# Patient Record
Sex: Male | Born: 1935 | Race: White | Hispanic: No | Marital: Married | Smoking: Never smoker
Health system: Southern US, Community
[De-identification: ages and names within clinical notes are randomized; demographics above are authoritative.]

## PROBLEM LIST (undated history)

## (undated) DIAGNOSIS — E875 Hyperkalemia: Secondary | ICD-10-CM

## (undated) DIAGNOSIS — E785 Hyperlipidemia, unspecified: Secondary | ICD-10-CM

## (undated) DIAGNOSIS — I714 Abdominal aortic aneurysm, without rupture: Secondary | ICD-10-CM

## (undated) DIAGNOSIS — I259 Chronic ischemic heart disease, unspecified: Secondary | ICD-10-CM

## (undated) DIAGNOSIS — E669 Obesity, unspecified: Secondary | ICD-10-CM

## (undated) DIAGNOSIS — I10 Essential (primary) hypertension: Secondary | ICD-10-CM

## (undated) DIAGNOSIS — I2510 Atherosclerotic heart disease of native coronary artery without angina pectoris: Secondary | ICD-10-CM

## (undated) DIAGNOSIS — I219 Acute myocardial infarction, unspecified: Secondary | ICD-10-CM

## (undated) DIAGNOSIS — I499 Cardiac arrhythmia, unspecified: Secondary | ICD-10-CM

## (undated) HISTORY — DX: Essential (primary) hypertension: I10

## (undated) HISTORY — PX: APPENDECTOMY: SHX54

## (undated) HISTORY — DX: Hyperlipidemia, unspecified: E78.5

## (undated) HISTORY — DX: Hyperkalemia: E87.5

## (undated) HISTORY — DX: Abdominal aortic aneurysm, without rupture: I71.4

## (undated) HISTORY — DX: Obesity, unspecified: E66.9

## (undated) HISTORY — PX: BACK SURGERY: SHX140

## (undated) HISTORY — PX: TONSILLECTOMY: SUR1361

## (undated) HISTORY — DX: Chronic ischemic heart disease, unspecified: I25.9

---

## 1986-06-13 HISTORY — PX: ANGIOPLASTY: SHX39

## 1986-06-13 HISTORY — PX: JOINT REPLACEMENT: SHX530

## 2009-05-20 ENCOUNTER — Encounter: Admission: RE | Admit: 2009-05-20 | Discharge: 2009-05-20 | Payer: Self-pay | Admitting: Cardiology

## 2009-11-17 ENCOUNTER — Ambulatory Visit: Payer: Self-pay | Admitting: Vascular Surgery

## 2009-11-17 DIAGNOSIS — I714 Abdominal aortic aneurysm, without rupture, unspecified: Secondary | ICD-10-CM

## 2009-11-17 HISTORY — DX: Abdominal aortic aneurysm, without rupture: I71.4

## 2009-11-17 HISTORY — DX: Abdominal aortic aneurysm, without rupture, unspecified: I71.40

## 2010-05-19 ENCOUNTER — Ambulatory Visit: Payer: Self-pay | Admitting: Cardiology

## 2010-05-26 ENCOUNTER — Ambulatory Visit: Payer: Self-pay | Admitting: Cardiology

## 2010-07-12 ENCOUNTER — Ambulatory Visit: Payer: Self-pay | Admitting: Cardiology

## 2010-10-23 IMAGING — US US AORTA SCREENING (MEDICARE)
1 series · 14 of 25 positions shown · non-contrast
Comparison: None

CLINICAL DATA: Hypertension

ABDOMINAL AORTA SCREENING ULTRASOUND
TECHNIQUE: Ultrasound examination of the abdominal aorta was
performed as a screening evaluation for abdominal aortic aneurysm.
The proximal iliac arteries were also evaluated bilaterally.

[Series 1: us aorta screening (medicare) · 0.35mm/px · 14 of 41 slices shown]
[im 1/41]
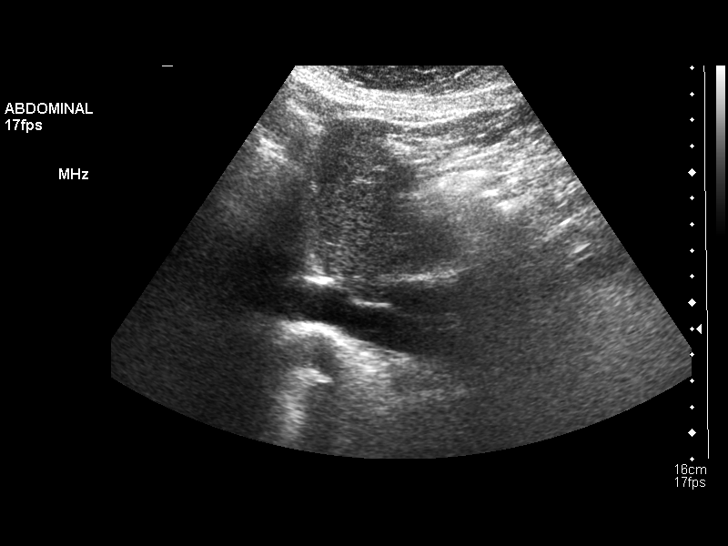
[im 4/41]
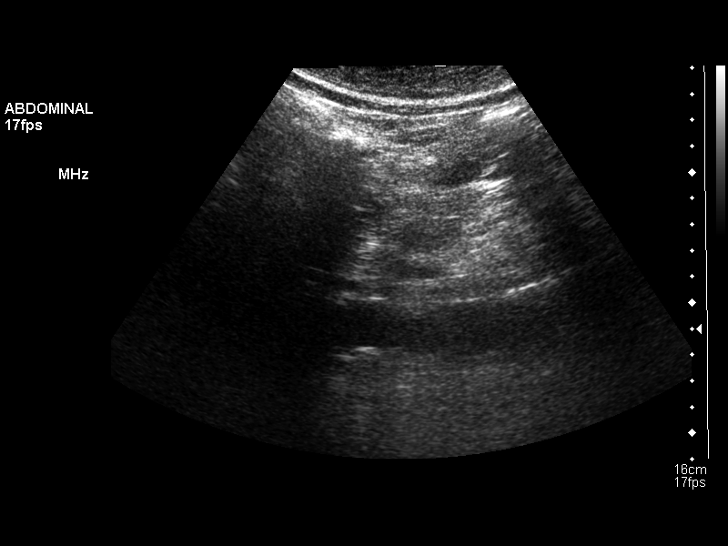
[im 7/41]
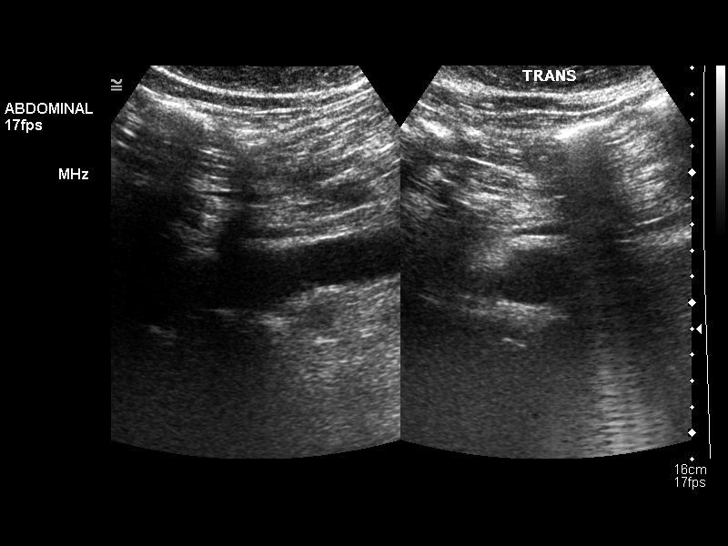
[im 11/41]
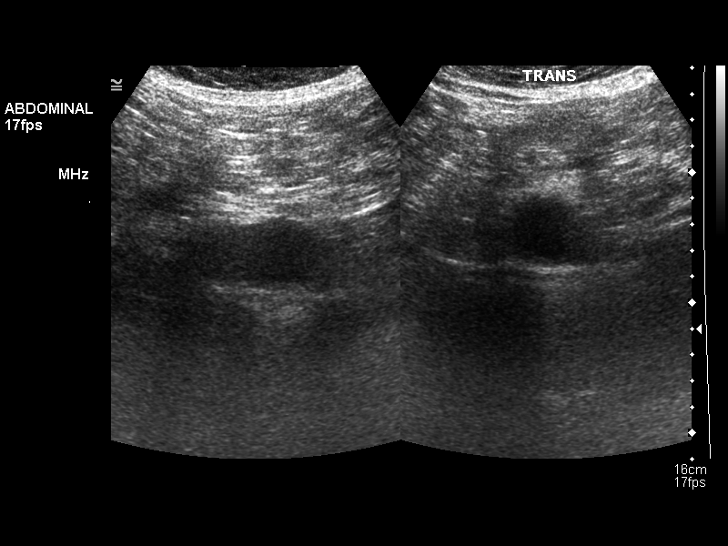
[im 14/41]
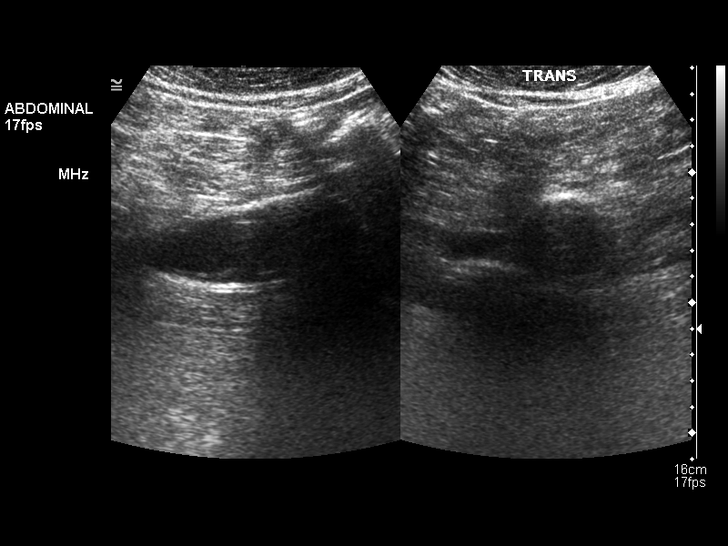
[im 16/41]
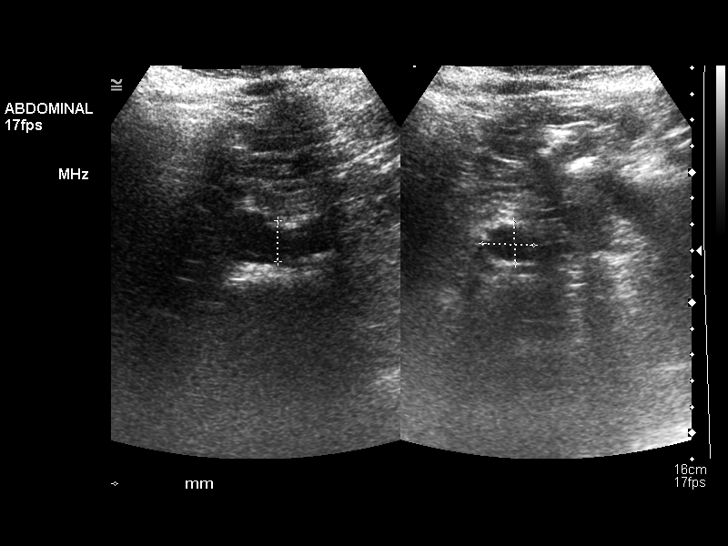
[im 19/41]
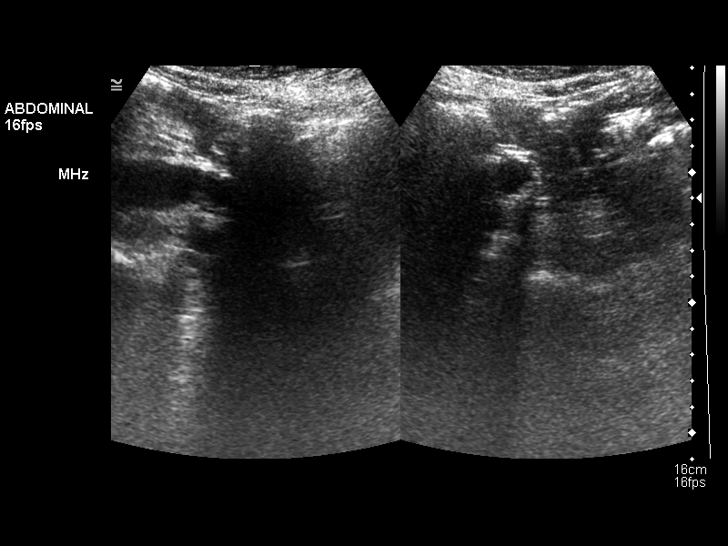
[im 22/41]
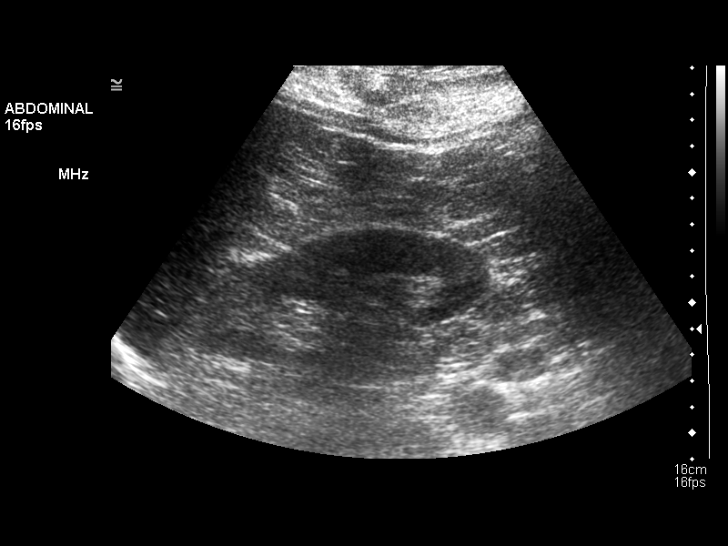
[im 26/41]
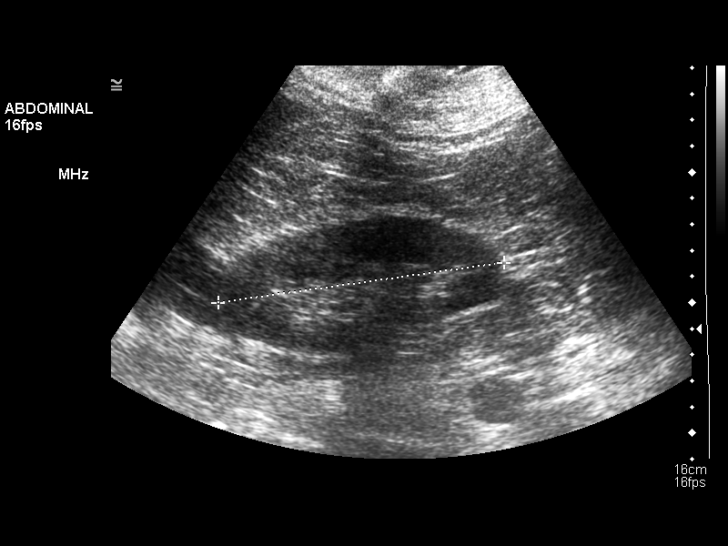
[im 27/41]
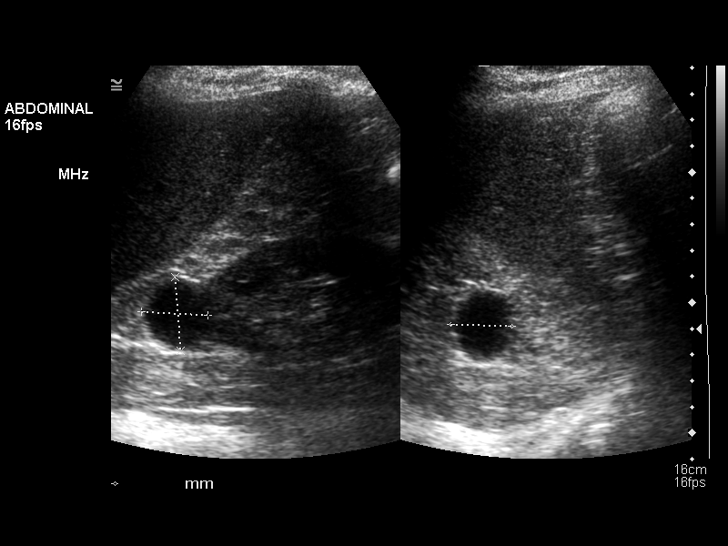
[im 31/41]
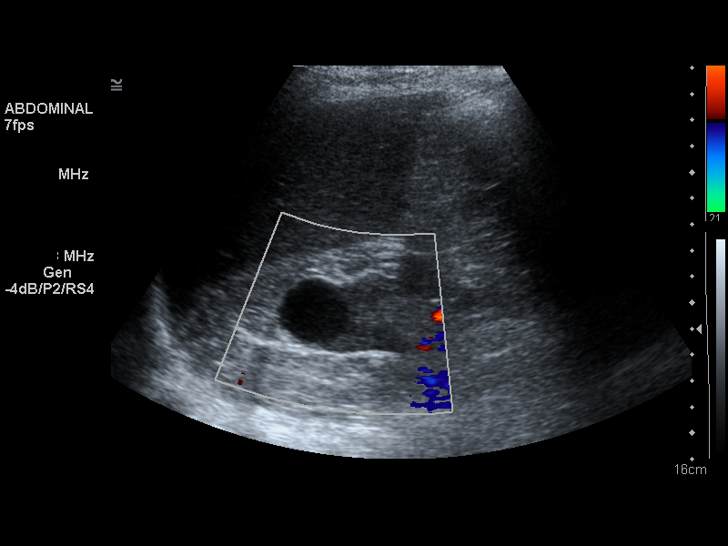
[im 34/41]
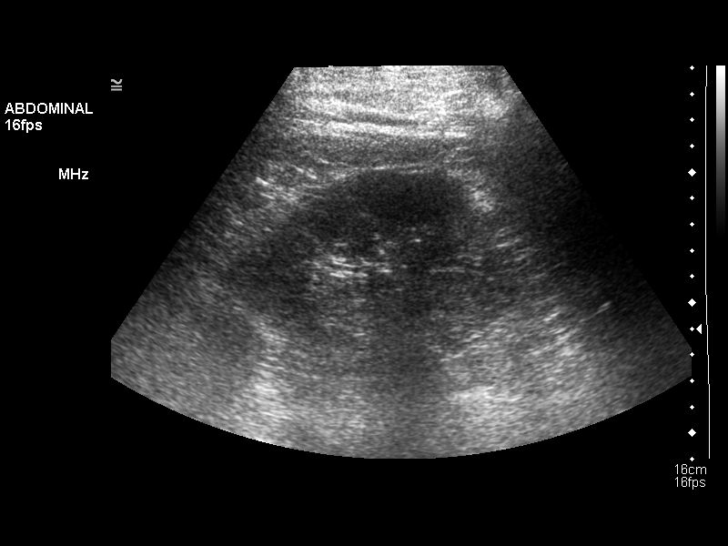
[im 37/41]
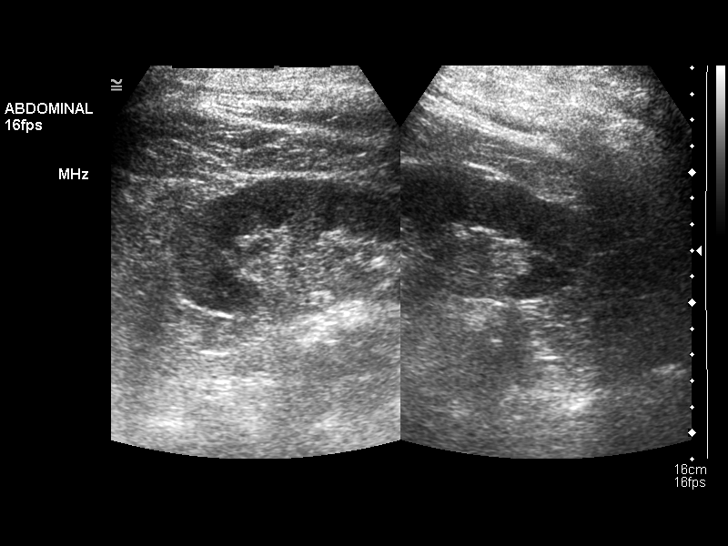
[im 41/41]
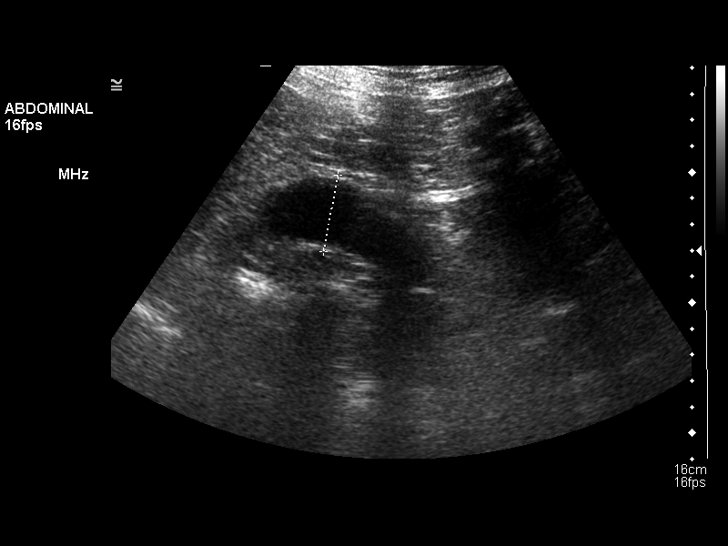

[14 of 25 positions shown; findings below may reference images not displayed]

FINDINGS: There is only a small bulge of the distal aorta with
maximum diameter of 3.3 x 3.6 cm.  Follow-up is recommended in 6
months to assess stability.  The common iliac arteries are slightly
prominent with the right measuring 18 mm in diameter and the left
measuring 19 mm.  No hydronephrosis is seen.  The right kidney
measures 11.9 cm sagittally with a cyst in the upper pole of 2.6 x
2.8 x 2.3 cm.  The left kidney measures 11.3 cm sagittally.  The
abdominal aorta is somewhat difficult to visualize due to abundant
bowel gas.
IMPRESSION: Small bulge of the distal abdominal aorta with maximal diameter of
3.3 x 3.6 cm.  Recommend follow-up ultrasound in 6-12 months to
assess stability

## 2010-10-26 NOTE — Procedures (Signed)
DUPLEX ULTRASOUND OF ABDOMINAL AORTA   INDICATION:  Known AAA.   HISTORY:  Diabetes:  No  Cardiac:  MI, angioplasty  Hypertension:  Yes  Smoking:  No  Family History:  No  Previous Surgery:  No   DUPLEX EXAM:         AP (cm)                   TRANSVERSE (cm)  Proximal             2.8 cm                    2.4 cm  Mid                  3.2 cm                    3.4 cm  Distal               2.1 cm                    2.3 cm  Right Iliac          1.5 cm                    1.5 cm  Left Iliac           1.7 cm                    1.7 cm   PREVIOUS:  Date:  AP:  TRANSVERSE:   IMPRESSION:  Abdominal aortic aneurysm with the largest diameter of 3.2  x 3.4 cm.   ___________________________________________  Quita Skye Hart [place], M.D.   NT/MEDQ  D:  11/17/2009  T:  11/17/2009  Job:  [id number]

## 2010-10-29 ENCOUNTER — Telehealth: Payer: Self-pay | Admitting: Cardiology

## 2010-10-29 NOTE — Telephone Encounter (Signed)
CALL PATIENT ABOUT WHO HE WILL SEE ONCE DR TENNANT RETIRES, WANTS TO GO TO SAME MD AS HIS SON HAS BEEN REFERRED TO.

## 2010-10-29 NOTE — Telephone Encounter (Signed)
Pt called, requesting to switch to Dr. Dietrich Pates when Dr. Cristobal Goldmann.  RN set pt up to see Dr. Tenny Craw on 11/18/10.  Pt aware of appointment.

## 2010-11-10 ENCOUNTER — Other Ambulatory Visit: Payer: Self-pay | Admitting: *Deleted

## 2010-11-10 DIAGNOSIS — E7800 Pure hypercholesterolemia, unspecified: Secondary | ICD-10-CM

## 2010-11-11 ENCOUNTER — Other Ambulatory Visit (INDEPENDENT_AMBULATORY_CARE_PROVIDER_SITE_OTHER): Payer: Medicare Other | Admitting: *Deleted

## 2010-11-11 ENCOUNTER — Telehealth: Payer: Self-pay | Admitting: *Deleted

## 2010-11-11 DIAGNOSIS — E7800 Pure hypercholesterolemia, unspecified: Secondary | ICD-10-CM

## 2010-11-11 LAB — HEPATIC FUNCTION PANEL
ALT: 28 U/L (ref 0–53)
Bilirubin, Direct: 0.2 mg/dL (ref 0.0–0.3)
Total Bilirubin: 0.9 mg/dL (ref 0.3–1.2)

## 2010-11-11 LAB — LIPID PANEL
Cholesterol: 160 mg/dL (ref 0–200)
HDL: 51.2 mg/dL (ref >39.00)
LDL Cholesterol: 87 mg/dL (ref 0–99)
Total CHOL/HDL Ratio: 3
Triglycerides: 110 mg/dL (ref 0.0–149.0)
VLDL: 22 mg/dL (ref 0.0–40.0)

## 2010-11-11 LAB — BASIC METABOLIC PANEL
BUN: 15 mg/dL (ref 6–23)
Chloride: 106 mEq/L (ref 96–112)
Creatinine, Ser: 0.9 mg/dL (ref 0.4–1.5)
GFR: 91.39 mL/min (ref >60.00)

## 2010-11-11 NOTE — Telephone Encounter (Signed)
Labs reported 

## 2010-11-13 ENCOUNTER — Encounter: Payer: Self-pay | Admitting: Internal Medicine

## 2010-11-18 ENCOUNTER — Ambulatory Visit (INDEPENDENT_AMBULATORY_CARE_PROVIDER_SITE_OTHER): Payer: Medicare Other | Admitting: Internal Medicine

## 2010-11-18 ENCOUNTER — Encounter: Payer: Self-pay | Admitting: Internal Medicine

## 2010-11-18 DIAGNOSIS — I119 Hypertensive heart disease without heart failure: Secondary | ICD-10-CM

## 2010-11-18 DIAGNOSIS — I2510 Atherosclerotic heart disease of native coronary artery without angina pectoris: Secondary | ICD-10-CM

## 2010-11-18 DIAGNOSIS — E785 Hyperlipidemia, unspecified: Secondary | ICD-10-CM

## 2010-11-18 DIAGNOSIS — I10 Essential (primary) hypertension: Secondary | ICD-10-CM

## 2010-11-18 NOTE — Progress Notes (Signed)
HPI Patient is an 77 year old with a history of CAD.  He was previously followed by S. Tennant. He is s/p MI and underwnt PTCA by Ronnald Nian. (no records) Since he was seen in clinic he has done well.  He remains very active, exercising regularly.  He denies chest pain.  No SOB.  No dizziness.  Says his BP is good at home.  No Known Allergies  Current Outpatient Prescriptions  Medication Sig Dispense Refill  . amLODipine (NORVASC) 5 MG tablet Take 5 mg by mouth. 2 po daily       . aspirin 81 MG tablet Take 81 mg by mouth daily.        Marland Kitchen atorvastatin (LIPITOR) 20 MG tablet Take 20 mg by mouth daily.        . calcium-vitamin D (OSCAL WITH D) 250-125 MG-UNIT per tablet Take 1 tablet by mouth daily.        . metoprolol (LOPRESSOR) 50 MG tablet Take 50 mg by mouth. 1 and 1/2 po bid       . multivitamin (THERAGRAN) per tablet Take 1 tablet by mouth daily.        . vitamin C (ASCORBIC ACID) 500 MG tablet Take 500 mg by mouth daily.        Marland Kitchen DISCONTD: atorvastatin (LIPITOR) 40 MG tablet Take by mouth daily.          Past Medical History  Diagnosis Date  . Ischemic heart disease   . Obesity   . HTN (hypertension)   . Hyperlipidemia   . Hyperkalemia   . Abdominal aortic aneurysm 11/17/2009    diameter of 3.2  x 3.4 cm.    Past Surgical History  Procedure Date  . Angioplasty     He had remote angioplasty to the lab following a subendocardial anterior MI.    Family History  Problem Relation Age of Onset  . Congenital heart disease Son     History   Social History  . Marital Status: Married    Spouse Name: N/A    Number of Children: 1  . Years of Education: N/A   Occupational History  .     Social History Main Topics  . Smoking status: Never Smoker   . Smokeless tobacco: Not on file  . Alcohol Use: Not on file  . Drug Use: Not on file  . Sexually Active: Not on file   Other Topics Concern  . Not on file   Social History Narrative  . No narrative on file    Review  of Systems:  All systems reviewed.  They are negative to the above problem except as previously stated.  Vital Signs: BP 148/88  Pulse 58  Resp 16  Ht 5\' 6"  (1.676 m)  Wt 180 lb (81.647 kg)  BMI 29.05 kg/m2  Physical Exam Patient is in NAD  HEENT:  Normocephalic, atraumatic. EOMI, PERRLA.  Neck: JVP is normal. No thyromegaly. No bruits.  Lungs: clear to auscultation. No rales no wheezes.  Heart: Regular rate and rhythm. Normal S1, S2. No S3.   No significant murmurs. PMI not displaced.  Abdomen:  Supple, nontender. Normal bowel sounds. No masses. No hepatomegaly.  Extremities:   Good distal pulses throughout. No lower extremity edema.  Musculoskeletal :moving all extremities.  Neuro:   alert and oriented x3.  CN II-XII grossly intact.  EKG:  Sinus bradycardia.  58 bpm.  First degree AV block.    Assessment and Plan:

## 2010-11-18 NOTE — Patient Instructions (Signed)
Your physician wants you to follow-up in: 1 year with Dr. Ross.  You will receive a reminder letter in the mail two months in advance. If you don't receive a letter, please call our office to schedule the follow-up appointment.  

## 2010-11-22 ENCOUNTER — Telehealth: Payer: Self-pay | Admitting: Internal Medicine

## 2010-11-22 DIAGNOSIS — I10 Essential (primary) hypertension: Secondary | ICD-10-CM | POA: Insufficient documentation

## 2010-11-22 DIAGNOSIS — I2510 Atherosclerotic heart disease of native coronary artery without angina pectoris: Secondary | ICD-10-CM | POA: Insufficient documentation

## 2010-11-22 DIAGNOSIS — E785 Hyperlipidemia, unspecified: Secondary | ICD-10-CM | POA: Insufficient documentation

## 2010-11-22 NOTE — Assessment & Plan Note (Signed)
No symptoms to suggest angina.  Encouraged him to stay active.

## 2010-11-22 NOTE — Telephone Encounter (Signed)
mailed

## 2010-11-22 NOTE — Assessment & Plan Note (Signed)
Keep on current regimen. 

## 2010-11-22 NOTE — Assessment & Plan Note (Signed)
Continue meds. 

## 2010-11-22 NOTE — Telephone Encounter (Signed)
Pt requesting blood work from 5-31 be mailed to him

## 2010-12-03 ENCOUNTER — Telehealth: Payer: Self-pay | Admitting: *Deleted

## 2010-12-03 DIAGNOSIS — I714 Abdominal aortic aneurysm, without rupture: Secondary | ICD-10-CM

## 2010-12-03 NOTE — Telephone Encounter (Signed)
Received message from Dr.Tennant's nurse that patient needs a follow up abdominal ultrasound for AAA.

## 2010-12-20 ENCOUNTER — Encounter (INDEPENDENT_AMBULATORY_CARE_PROVIDER_SITE_OTHER): Payer: Medicare Other | Admitting: Cardiology

## 2010-12-20 DIAGNOSIS — I723 Aneurysm of iliac artery: Secondary | ICD-10-CM

## 2010-12-20 DIAGNOSIS — I714 Abdominal aortic aneurysm, without rupture, unspecified: Secondary | ICD-10-CM

## 2010-12-23 ENCOUNTER — Telehealth: Payer: Self-pay | Admitting: Internal Medicine

## 2010-12-23 ENCOUNTER — Encounter: Payer: Self-pay | Admitting: Internal Medicine

## 2010-12-23 NOTE — Telephone Encounter (Signed)
ROI completed,Dolppler Mailed to PT Address.  12/23/10/km

## 2011-04-11 ENCOUNTER — Telehealth: Payer: Self-pay | Admitting: Internal Medicine

## 2011-04-11 MED ORDER — ATORVASTATIN CALCIUM 20 MG PO TABS: 20.0000 mg | ORAL_TABLET | Freq: Every day | ORAL | Status: DC

## 2011-04-11 MED ORDER — METOPROLOL TARTRATE 50 MG PO TABS: 75.0000 mg | ORAL_TABLET | Freq: Two times a day (BID) | ORAL | Status: DC

## 2011-04-11 NOTE — Telephone Encounter (Signed)
Please fax to [facility] mail order 90 day supply/phone [phone]/ fax [phone]

## 2011-04-14 NOTE — Telephone Encounter (Signed)
PT'S WIFE CALLING TO ASK THAT THE REFILL BE CALLED IN TODAY, REQUESTED ON Monday AND PT ALMOST OUT AND IT'S A MAIL ORDER, PT'S WIFE REQUEST CALL WHEN DONE AND OK TO LEAVE MESSAGE

## 2011-04-14 NOTE — Telephone Encounter (Signed)
Pt's wife calling again [facility] still doesn't have rx, pls call in asap and call pt's wife when done

## 2011-04-14 NOTE — Telephone Encounter (Signed)
Pt called [facility] and the refills are not in the system.  Metoprolol, and Lipitor generic. Please call this in to [facility] again and advise patient when this is done.

## 2011-04-15 ENCOUNTER — Other Ambulatory Visit: Payer: Self-pay | Admitting: *Deleted

## 2011-04-15 NOTE — Telephone Encounter (Signed)
Called into pharmacy

## 2011-04-15 NOTE — Telephone Encounter (Signed)
Couldn't send in electronically called in over phone

## 2011-06-27 ENCOUNTER — Other Ambulatory Visit: Payer: Self-pay | Admitting: Internal Medicine

## 2011-06-27 DIAGNOSIS — E782 Mixed hyperlipidemia: Secondary | ICD-10-CM

## 2011-06-27 MED ORDER — AMLODIPINE BESYLATE 5 MG PO TABS: 5.0000 mg | ORAL_TABLET | Freq: Every day | ORAL | Status: DC

## 2011-06-27 NOTE — Telephone Encounter (Signed)
New msg: pt wife calling stating that she would like a hard copy of RX amlodopine 5 mg, 1 tablet, 2 x/day--90 day supply with one years worth of refills. Pt wife stated she has had a difficult time in the past getting pt medications refilled (pt wife stated it was the fault of [facility]) and therefore pt wife would like a hard copy so she can mail in the RX. Please return pt wife call to discuss further.

## 2011-06-27 NOTE — Telephone Encounter (Signed)
Called patient and wife. Will mail hard copy of Norvasc 5 bid to pt's home. Wants to set up lab appointment for 1/15. Advised him to fast for lipid panel.

## 2011-06-28 ENCOUNTER — Other Ambulatory Visit (INDEPENDENT_AMBULATORY_CARE_PROVIDER_SITE_OTHER): Payer: Medicare Other | Admitting: *Deleted

## 2011-06-28 DIAGNOSIS — E782 Mixed hyperlipidemia: Secondary | ICD-10-CM

## 2011-06-28 LAB — LIPID PANEL
LDL Cholesterol: 87 mg/dL (ref 0–99)
Total CHOL/HDL Ratio: 3

## 2011-07-01 ENCOUNTER — Other Ambulatory Visit: Payer: Self-pay | Admitting: Internal Medicine

## 2011-07-01 NOTE — Telephone Encounter (Signed)
Refill  (2nd Request) Patient needs hardcopy of  amLODipine (NORVASC) 5 MG tablet.  Requesting call from nurse to discuss.  Patient wife can be reached at hm#

## 2011-07-04 NOTE — Telephone Encounter (Signed)
I sent this in the mail last week and confirmed with patient that he received script on 07/01/2011.

## 2011-07-14 ENCOUNTER — Other Ambulatory Visit: Payer: Self-pay | Admitting: Internal Medicine

## 2011-07-14 MED ORDER — AMLODIPINE BESYLATE 5 MG PO TABS: ORAL_TABLET | ORAL | Status: DC

## 2011-07-14 NOTE — Telephone Encounter (Signed)
Clarification for Amlodipine 5 mg, pt to take one tablet by mouth twice a day, called to [facility] mail order as requested per pt. Patient aware.

## 2011-07-14 NOTE — Telephone Encounter (Signed)
New Msg: Pt wife calling wanting to speak with Annice Pih regarding pt RX, amlodipine. Pharmacy said RX instructions are confusing and refuses to fill medication without further clarification.   Please call new RX into [facility] and/or call pharmacy to clarify RX directions.

## 2011-12-22 ENCOUNTER — Telehealth: Payer: Self-pay | Admitting: Internal Medicine

## 2011-12-22 DIAGNOSIS — E7800 Pure hypercholesterolemia, unspecified: Secondary | ICD-10-CM

## 2011-12-22 DIAGNOSIS — I10 Essential (primary) hypertension: Secondary | ICD-10-CM

## 2011-12-22 DIAGNOSIS — I2510 Atherosclerotic heart disease of native coronary artery without angina pectoris: Secondary | ICD-10-CM

## 2011-12-22 NOTE — Telephone Encounter (Signed)
PT REQUESTING BLOOD TEST PRIOR TO FU APPT WITH [facility], CAN GET ORDER?

## 2011-12-22 NOTE — Telephone Encounter (Signed)
Called patient back. He would like to set up lab work prior to seeing Dr.Ross on 8/2. Has not had any lab work done at [facility] office. Mentioned that he started Cozaar 50mg  in April. Set up for labs on 7/25

## 2012-01-05 ENCOUNTER — Other Ambulatory Visit (INDEPENDENT_AMBULATORY_CARE_PROVIDER_SITE_OTHER): Payer: Medicare Other

## 2012-01-05 DIAGNOSIS — E7800 Pure hypercholesterolemia, unspecified: Secondary | ICD-10-CM

## 2012-01-05 DIAGNOSIS — I2510 Atherosclerotic heart disease of native coronary artery without angina pectoris: Secondary | ICD-10-CM

## 2012-01-05 DIAGNOSIS — I10 Essential (primary) hypertension: Secondary | ICD-10-CM

## 2012-01-05 LAB — BASIC METABOLIC PANEL
CO2: 25 mEq/L (ref 19–32)
Calcium: 9.3 mg/dL (ref 8.4–10.5)
Creatinine, Ser: 1.1 mg/dL (ref 0.4–1.5)
GFR: 66.98 mL/min (ref >60.00)
Glucose, Bld: 101 mg/dL — ABNORMAL HIGH (ref 70–99)
Sodium: 136 mEq/L (ref 135–145)

## 2012-01-05 LAB — CBC WITH DIFFERENTIAL/PLATELET
Basophils Absolute: 0 10*3/uL (ref 0.0–0.1)
Eosinophils Relative: 4.7 % (ref 0.0–5.0)
HCT: 47.2 % (ref 39.0–52.0)
Hemoglobin: 15.4 g/dL (ref 13.0–17.0)
Lymphocytes Relative: 18.9 % (ref 12.0–46.0)
Lymphs Abs: 1.6 10*3/uL (ref 0.7–4.0)
Monocytes Relative: 10.3 % (ref 3.0–12.0)
Neutro Abs: 5.4 10*3/uL (ref 1.4–7.7)
RBC: 5.32 Mil/uL (ref 4.22–5.81)
RDW: 14.3 % (ref 11.5–14.6)
WBC: 8.3 10*3/uL (ref 4.5–10.5)

## 2012-01-05 LAB — LIPID PANEL
HDL: 45.1 mg/dL (ref >39.00)
Total CHOL/HDL Ratio: 3
Triglycerides: 83 mg/dL (ref 0.0–149.0)
VLDL: 16.6 mg/dL (ref 0.0–40.0)

## 2012-01-09 ENCOUNTER — Telehealth: Payer: Self-pay | Admitting: Internal Medicine

## 2012-01-13 ENCOUNTER — Encounter: Payer: Self-pay | Admitting: Internal Medicine

## 2012-01-13 ENCOUNTER — Ambulatory Visit (INDEPENDENT_AMBULATORY_CARE_PROVIDER_SITE_OTHER): Payer: Medicare Other | Admitting: Internal Medicine

## 2012-01-13 VITALS — BP 140/77 | HR 64 | Ht 66.0 in | Wt 180.0 lb

## 2012-01-13 DIAGNOSIS — K5900 Constipation, unspecified: Secondary | ICD-10-CM

## 2012-01-13 DIAGNOSIS — I2510 Atherosclerotic heart disease of native coronary artery without angina pectoris: Secondary | ICD-10-CM

## 2012-01-13 DIAGNOSIS — I10 Essential (primary) hypertension: Secondary | ICD-10-CM

## 2012-01-13 MED ORDER — LOSARTAN POTASSIUM 50 MG PO TABS: 50.0000 mg | ORAL_TABLET | Freq: Every day | ORAL | Status: AC

## 2012-01-13 NOTE — Progress Notes (Signed)
Marland Kitchen HPI Patient is an 78 year old with a history of CAD (s/p MI and PTCA), HTN and dyslipidemia.  He used to be a patient of Ronnald Nian. I saw him for the first time in June 2012. SInce seen he has done well from a cardiac standpoint. Breathing is OK  No dizziness.  No palpitiatons.  No CP  He remains active. No Known Allergies  Current Outpatient Prescriptions  Medication Sig Dispense Refill  . amLODipine (NORVASC) 5 MG tablet Take one tablet by mouth twice a day.  180 tablet  3  . aspirin 81 MG tablet Take 81 mg by mouth daily.        Marland Kitchen atorvastatin (LIPITOR) 20 MG tablet Take 1 tablet (20 mg total) by mouth daily.  90 tablet  1  . calcium-vitamin D (OSCAL WITH D) 250-125 MG-UNIT per tablet Take 1 tablet by mouth daily.       Marland Kitchen losartan (COZAAR) 50 MG tablet Take 1 tablet (50 mg total) by mouth daily.  90 tablet  3  . metoprolol (LOPRESSOR) 50 MG tablet Take 1.5 tablets (75 mg total) by mouth 2 (two) times daily.  270 tablet  1  . multivitamin (THERAGRAN) per tablet Take 1 tablet by mouth daily.        . polyethylene glycol (MIRALAX / GLYCOLAX) packet Take 17 g by mouth daily. CLEARLAX      . vitamin C (ASCORBIC ACID) 500 MG tablet Take 500 mg by mouth daily.          Past Medical History  Diagnosis Date  . Ischemic heart disease   . Obesity   . HTN (hypertension)   . Hyperlipidemia   . Hyperkalemia   . Abdominal aortic aneurysm 11/17/2009    diameter of 3.2  x 3.4 cm.    Past Surgical History  Procedure Date  . Angioplasty     He had remote angioplasty to the lab following a subendocardial anterior MI.    Family History  Problem Relation Age of Onset  . Congenital heart disease Son     History   Social History  . Marital Status: Married    Spouse Name: N/A    Number of Children: 1  . Years of Education: N/A   Occupational History  .     Social History Main Topics  . Smoking status: Never Smoker   . Smokeless tobacco: Not on file  . Alcohol Use: Not on file  .  Drug Use: Not on file  . Sexually Active: Not on file   Other Topics Concern  . Not on file   Social History Narrative  . No narrative on file    Review of Systems:  All systems reviewed.  They are negative to the above problem except as previously stated.  Vital Signs: BP 140/77  Pulse 64  Ht 5\' 6"  (1.676 m)  Wt 180 lb (81.647 kg)  BMI 29.05 kg/m2  Physical Exam Patient is in NAD HEENT:  Normocephalic, atraumatic. EOMI, PERRLA.  Neck: JVP is normal. No thyromegaly. No bruits.  Lungs: clear to auscultation. No rales no wheezes.  Heart: Regular rate and rhythm. Normal S1, S2. No S3.   No significant murmurs. PMI not displaced.  Abdomen:  Supple, nontender. Normal bowel sounds. No masses. No hepatomegaly.  Extremities:   Good distal pulses throughout. No lower extremity edema.  Musculoskeletal :moving all extremities.  Neuro:   alert and oriented x3.  CN II-XII grossly intact.  EKG  SR.  64.  PVCs.  LAFB.  LVH.   Assessment and Plan:  1.  CAD  No records.  S/p PTCA in past.  Active now.  No symptoms to suggest angina  2.  HTN  Adequate control of BP  3.  HL  Excellent when checked last.

## 2012-01-13 NOTE — Patient Instructions (Addendum)
Your physician wants you to follow-up in:  12 months.  You will receive a reminder letter in the mail two months in advance. If you don't receive a letter, please call our office to schedule the follow-up appointment.   

## 2012-01-16 ENCOUNTER — Other Ambulatory Visit: Payer: Self-pay | Admitting: Cardiology

## 2012-01-16 DIAGNOSIS — I714 Abdominal aortic aneurysm, without rupture: Secondary | ICD-10-CM

## 2012-01-20 ENCOUNTER — Encounter (INDEPENDENT_AMBULATORY_CARE_PROVIDER_SITE_OTHER): Payer: Medicare Other

## 2012-01-20 DIAGNOSIS — I714 Abdominal aortic aneurysm, without rupture: Secondary | ICD-10-CM

## 2012-01-23 NOTE — Addendum Note (Signed)
Addended by: Burnett Kanaris A on: 01/23/2012 03:08 PM   Modules accepted: Orders

## 2012-03-09 ENCOUNTER — Encounter: Payer: Self-pay | Admitting: Cardiology

## 2012-03-16 ENCOUNTER — Encounter: Payer: Self-pay | Admitting: Cardiology

## 2012-04-23 ENCOUNTER — Other Ambulatory Visit: Payer: Self-pay | Admitting: Internal Medicine

## 2012-05-02 ENCOUNTER — Telehealth: Payer: Self-pay | Admitting: Internal Medicine

## 2012-05-02 ENCOUNTER — Other Ambulatory Visit: Payer: Self-pay

## 2012-05-02 MED ORDER — METOPROLOL TARTRATE 50 MG PO TABS: 75.0000 mg | ORAL_TABLET | Freq: Two times a day (BID) | ORAL | Status: DC

## 2012-05-02 NOTE — Telephone Encounter (Signed)
Mrs Stifel is calling because the rx bottle of Metoprolol from the pharmacy states no refills.  She is requesting refills be sent to [facility].  New rx for metoprolol was sent to [facility] with refills.

## 2012-05-02 NOTE — Telephone Encounter (Signed)
plz return call to pt wife cell [phone], she would like to discuss medication

## 2012-06-11 ENCOUNTER — Other Ambulatory Visit: Payer: Self-pay | Admitting: Internal Medicine

## 2012-10-29 ENCOUNTER — Telehealth: Payer: Self-pay | Admitting: Internal Medicine

## 2012-10-29 ENCOUNTER — Other Ambulatory Visit: Payer: Self-pay | Admitting: *Deleted

## 2012-10-29 DIAGNOSIS — E785 Hyperlipidemia, unspecified: Secondary | ICD-10-CM

## 2012-10-29 DIAGNOSIS — I10 Essential (primary) hypertension: Secondary | ICD-10-CM

## 2012-10-29 DIAGNOSIS — I2510 Atherosclerotic heart disease of native coronary artery without angina pectoris: Secondary | ICD-10-CM

## 2012-10-29 NOTE — Telephone Encounter (Signed)
Labs ordered and patient notified that he needs to be fasting.

## 2012-10-29 NOTE — Telephone Encounter (Signed)
New problem   Pt scheduled for labs 01/25/13 and need to know if it's fasting labs. Please call pt.

## 2013-01-24 ENCOUNTER — Ambulatory Visit (INDEPENDENT_AMBULATORY_CARE_PROVIDER_SITE_OTHER): Payer: Medicare Other | Admitting: *Deleted

## 2013-01-24 ENCOUNTER — Encounter (INDEPENDENT_AMBULATORY_CARE_PROVIDER_SITE_OTHER): Payer: Medicare Other

## 2013-01-24 DIAGNOSIS — I714 Abdominal aortic aneurysm, without rupture: Secondary | ICD-10-CM

## 2013-01-24 DIAGNOSIS — E785 Hyperlipidemia, unspecified: Secondary | ICD-10-CM

## 2013-01-24 DIAGNOSIS — I2510 Atherosclerotic heart disease of native coronary artery without angina pectoris: Secondary | ICD-10-CM

## 2013-01-24 DIAGNOSIS — I10 Essential (primary) hypertension: Secondary | ICD-10-CM

## 2013-01-24 LAB — BASIC METABOLIC PANEL
BUN: 16 mg/dL (ref 6–23)
Chloride: 105 mEq/L (ref 96–112)
Glucose, Bld: 99 mg/dL (ref 70–99)
Potassium: 3.9 mEq/L (ref 3.5–5.1)
Sodium: 138 mEq/L (ref 135–145)

## 2013-01-24 LAB — LIPID PANEL
Cholesterol: 167 mg/dL (ref 0–200)
LDL Cholesterol: 104 mg/dL — ABNORMAL HIGH (ref 0–99)
VLDL: 19 mg/dL (ref 0.0–40.0)

## 2013-01-24 LAB — AST: AST: 23 U/L (ref 0–37)

## 2013-01-24 LAB — CBC WITH DIFFERENTIAL/PLATELET
Eosinophils Relative: 4 % (ref 0.0–5.0)
HCT: 44.7 % (ref 39.0–52.0)
Hemoglobin: 14.7 g/dL (ref 13.0–17.0)
Lymphs Abs: 1.5 10*3/uL (ref 0.7–4.0)
MCV: 88.2 fl (ref 78.0–100.0)
Monocytes Absolute: 0.7 10*3/uL (ref 0.1–1.0)
Monocytes Relative: 10.9 % (ref 3.0–12.0)
Neutro Abs: 4.3 10*3/uL (ref 1.4–7.7)
Platelets: 165 10*3/uL (ref 150.0–400.0)
RDW: 13.9 % (ref 11.5–14.6)
WBC: 6.8 10*3/uL (ref 4.5–10.5)

## 2013-01-25 ENCOUNTER — Other Ambulatory Visit: Payer: Medicare Other

## 2013-01-30 ENCOUNTER — Telehealth: Payer: Self-pay | Admitting: *Deleted

## 2013-01-30 NOTE — Telephone Encounter (Signed)
Message copied by Burnell Blanks on Wed Jan 30, 2013  6:50 PM ------      Message from: Dietrich Pates V      Created: Wed Jan 30, 2013  3:38 PM       CBC and BMET are normal      LDL is higher than it was 1 year ago.  Was 76  Now 104       I would keep on current regimen/  Keep active.  Watch saturated fats.      Next spring I would check lipomed. ------

## 2013-01-30 NOTE — Telephone Encounter (Signed)
Advised patient

## 2013-01-30 NOTE — Telephone Encounter (Signed)
Message copied by Burnell Blanks on Wed Jan 30, 2013  6:51 PM ------      Message from: Dietrich Pates V      Created: Thu Jan 24, 2013  3:07 PM       Mild atherosclerosis of aorta.      Aortic stize is stable  Minimally dilated       Rec repeat in 1 year. ------

## 2013-02-01 ENCOUNTER — Encounter: Payer: Self-pay | Admitting: Internal Medicine

## 2013-02-01 ENCOUNTER — Ambulatory Visit (INDEPENDENT_AMBULATORY_CARE_PROVIDER_SITE_OTHER): Payer: Medicare Other | Admitting: Internal Medicine

## 2013-02-01 VITALS — BP 140/80 | HR 60 | Resp 18 | Ht 66.0 in | Wt 185.1 lb

## 2013-02-01 DIAGNOSIS — E785 Hyperlipidemia, unspecified: Secondary | ICD-10-CM

## 2013-02-01 DIAGNOSIS — I10 Essential (primary) hypertension: Secondary | ICD-10-CM

## 2013-02-01 NOTE — Patient Instructions (Signed)
Your physician recommends that you continue on your current medications as directed. Please refer to the Current Medication list given to you today.  Your physician wants you to follow-up in: 1 year. You will receive a reminder letter in the mail two months in advance. If you don't receive a letter, please call our office to schedule the follow-up appointment.  

## 2013-02-01 NOTE — Progress Notes (Signed)
HPI Patient is an 79 year old with a history of CAD (s/p MI and PTCA), HTN and dyslipidemia.I saw him 1 year ago  He had been a patient of S Tennant's in the past. Since seen he has done well  He deines Cp  No dizziness  No SOB   He admits to eating more fatty foods  Trying to cut down on carbs  Trying to lose wt. He is very active  Works out 4x per week  Volunteers at [employer].  No Known Allergies  Current Outpatient Prescriptions  Medication Sig Dispense Refill  . amLODipine (NORVASC) 5 MG tablet TAKE 1 TABLET TWICE DAILY  180 tablet  PRN  . aspirin 81 MG tablet Take 81 mg by mouth daily.        Marland Kitchen atorvastatin (LIPITOR) 20 MG tablet TAKE 1 TABLET DAILY  90 tablet  PRN  . calcium carbonate (OS-CAL) 600 MG TABS tablet Take 600 mg by mouth 2 (two) times daily with a meal.      . losartan (COZAAR) 50 MG tablet Take 1 tablet (50 mg total) by mouth daily.  90 tablet  3  . metoprolol (LOPRESSOR) 50 MG tablet Take 1.5 tablets (75 mg total) by mouth 2 (two) times daily.  270 tablet  2  . multivitamin (THERAGRAN) per tablet Take 1 tablet by mouth daily.        . polyethylene glycol (MIRALAX / GLYCOLAX) packet Take 17 g by mouth daily. CLEARLAX      . vitamin C (ASCORBIC ACID) 500 MG tablet Take 500 mg by mouth daily.         No current facility-administered medications for this visit.    Past Medical History  Diagnosis Date  . Ischemic heart disease   . Obesity   . HTN (hypertension)   . Hyperlipidemia   . Hyperkalemia   . Abdominal aortic aneurysm 11/17/2009    diameter of 3.2  x 3.4 cm.    Past Surgical History  Procedure Laterality Date  . Angioplasty      He had remote angioplasty to the lab following a subendocardial anterior MI.    Family History  Problem Relation Age of Onset  . Congenital heart disease Son     History   Social History  . Marital Status: Married    Spouse Name: N/A    Number of Children: 1  . Years of Education: N/A   Occupational History  .      Social History Main Topics  . Smoking status: Never Smoker   . Smokeless tobacco: Not on file  . Alcohol Use: Not on file  . Drug Use: Not on file  . Sexual Activity: Not on file   Other Topics Concern  . Not on file   Social History Narrative  . No narrative on file    Review of Systems:  All systems reviewed.  They are negative to the above problem except as previously stated.  Vital Signs: BP 140/80  Pulse 60  Resp 18  Ht 5\' 6"  (1.676 m)  Wt 185 lb 1.9 oz (83.97 kg)  BMI 29.89 kg/m2  Physical Exam Patient is in NAD HEENT:  Normocephalic, atraumatic. EOMI, PERRLA.  Neck: JVP is normal.  No bruits.  Lungs: clear to auscultation. No rales no wheezes.  Heart: Regular rate and rhythm. Normal S1, S2. No S3.   No significant murmurs. PMI not displaced.  Abdomen:  Supple, nontender. Normal bowel sounds. No masses. No hepatomegaly.  Extremities:   Good distal pulses throughout. No lower extremity edema.  Musculoskeletal :moving all extremities.  Neuro:   alert and oriented x3.  CN II-XII grossly intact.  EKG  SB 55  First degree AV block.  Pr interval 328 msec  Occasional PVC Assessment and Plan:  1.  CAD  Asymptomatic  Encouraged him to stay active  2.  HTn  Adequate control  3.  HL  LDL is higher than it has been  He did well for a long time  Admits to diet indescretions.  Will write order for him to have checked at Dr Tenny Craw' office.  F/U 1 year.

## 2013-02-18 ENCOUNTER — Other Ambulatory Visit: Payer: Self-pay | Admitting: Internal Medicine

## 2013-02-22 ENCOUNTER — Other Ambulatory Visit: Payer: Self-pay

## 2013-02-22 MED ORDER — ATORVASTATIN CALCIUM 20 MG PO TABS: ORAL_TABLET | ORAL | Status: DC

## 2013-03-07 ENCOUNTER — Other Ambulatory Visit: Payer: Self-pay

## 2013-03-07 MED ORDER — ATORVASTATIN CALCIUM 20 MG PO TABS: ORAL_TABLET | ORAL | Status: DC

## 2013-03-07 MED ORDER — AMLODIPINE BESYLATE 5 MG PO TABS: ORAL_TABLET | ORAL | Status: DC

## 2013-03-07 MED ORDER — METOPROLOL TARTRATE 50 MG PO TABS: ORAL_TABLET | ORAL | Status: DC

## 2013-07-23 ENCOUNTER — Encounter: Payer: Self-pay | Admitting: Internal Medicine

## 2013-12-26 NOTE — Telephone Encounter (Signed)
Close encounter 

## 2014-02-14 ENCOUNTER — Other Ambulatory Visit (HOSPITAL_COMMUNITY): Payer: Self-pay | Admitting: *Deleted

## 2014-02-14 DIAGNOSIS — I714 Abdominal aortic aneurysm, without rupture, unspecified: Secondary | ICD-10-CM

## 2014-02-21 ENCOUNTER — Ambulatory Visit (INDEPENDENT_AMBULATORY_CARE_PROVIDER_SITE_OTHER): Payer: Medicare Other | Admitting: *Deleted

## 2014-02-21 DIAGNOSIS — E785 Hyperlipidemia, unspecified: Secondary | ICD-10-CM

## 2014-02-21 DIAGNOSIS — I10 Essential (primary) hypertension: Secondary | ICD-10-CM

## 2014-02-23 LAB — NMR LIPOPROFILE WITH LIPIDS
CHOLESTEROL, TOTAL: 169 mg/dL (ref 100–199)
HDL PARTICLE NUMBER: 22.6 umol/L — AB (ref >=30.5)
HDL Size: 9.3 nm (ref >=9.2)
HDL-C: 46 mg/dL (ref >39)
LARGE HDL: 6.2 umol/L (ref >=4.8)
LDL CALC: 102 mg/dL — AB (ref 0–99)
LDL Particle Number: 1215 nmol/L — ABNORMAL HIGH (ref <1000)
LDL SIZE: 20.7 nm (ref >=20.8)
LP-IR SCORE: 30 (ref <=45)
Large VLDL-P: 1.4 nmol/L (ref <=2.7)
SMALL LDL PARTICLE NUMBER: 335 nmol/L (ref <=527)
Triglycerides: 105 mg/dL (ref 0–149)
VLDL Size: 41.1 nm (ref <=46.6)

## 2014-02-28 ENCOUNTER — Ambulatory Visit (HOSPITAL_COMMUNITY): Payer: Medicare Other | Attending: Cardiovascular Disease | Admitting: Cardiology

## 2014-02-28 ENCOUNTER — Telehealth: Payer: Self-pay | Admitting: Internal Medicine

## 2014-02-28 ENCOUNTER — Ambulatory Visit (INDEPENDENT_AMBULATORY_CARE_PROVIDER_SITE_OTHER): Payer: Medicare Other | Admitting: Internal Medicine

## 2014-02-28 ENCOUNTER — Encounter: Payer: Self-pay | Admitting: Internal Medicine

## 2014-02-28 VITALS — BP 138/76 | HR 46 | Ht 66.0 in | Wt 166.0 lb

## 2014-02-28 DIAGNOSIS — Z7982 Long term (current) use of aspirin: Secondary | ICD-10-CM | POA: Insufficient documentation

## 2014-02-28 DIAGNOSIS — E785 Hyperlipidemia, unspecified: Secondary | ICD-10-CM | POA: Insufficient documentation

## 2014-02-28 DIAGNOSIS — I714 Abdominal aortic aneurysm, without rupture, unspecified: Secondary | ICD-10-CM

## 2014-02-28 DIAGNOSIS — I4891 Unspecified atrial fibrillation: Secondary | ICD-10-CM

## 2014-02-28 DIAGNOSIS — I700 Atherosclerosis of aorta: Secondary | ICD-10-CM

## 2014-02-28 DIAGNOSIS — I10 Essential (primary) hypertension: Secondary | ICD-10-CM | POA: Insufficient documentation

## 2014-02-28 DIAGNOSIS — I252 Old myocardial infarction: Secondary | ICD-10-CM | POA: Diagnosis not present

## 2014-02-28 DIAGNOSIS — Z79899 Other long term (current) drug therapy: Secondary | ICD-10-CM | POA: Insufficient documentation

## 2014-02-28 DIAGNOSIS — Z9861 Coronary angioplasty status: Secondary | ICD-10-CM | POA: Insufficient documentation

## 2014-02-28 DIAGNOSIS — I2510 Atherosclerotic heart disease of native coronary artery without angina pectoris: Secondary | ICD-10-CM | POA: Insufficient documentation

## 2014-02-28 LAB — BASIC METABOLIC PANEL
BUN: 29 mg/dL — ABNORMAL HIGH (ref 6–23)
CALCIUM: 10.1 mg/dL (ref 8.4–10.5)
CO2: 25 mEq/L (ref 19–32)
Chloride: 101 mEq/L (ref 96–112)
Creatinine, Ser: 1.4 mg/dL (ref 0.4–1.5)
GFR: 50.98 mL/min — AB (ref >60.00)
GLUCOSE: 111 mg/dL — AB (ref 70–99)
Potassium: 4.7 mEq/L (ref 3.5–5.1)
Sodium: 138 mEq/L (ref 135–145)

## 2014-02-28 LAB — CBC
HEMATOCRIT: 49.8 % (ref 39.0–52.0)
Hemoglobin: 16.3 g/dL (ref 13.0–17.0)
MCHC: 32.7 g/dL (ref 30.0–36.0)
MCV: 87.5 fl (ref 78.0–100.0)
Platelets: 205 10*3/uL (ref 150.0–400.0)
RBC: 5.69 Mil/uL (ref 4.22–5.81)
RDW: 14.8 % (ref 11.5–15.5)
WBC: 10.1 10*3/uL (ref 4.0–10.5)

## 2014-02-28 LAB — TSH: TSH: 1.01 u[IU]/mL (ref 0.35–4.50)

## 2014-02-28 MED ORDER — APIXABAN 5 MG PO TABS: 5.0000 mg | ORAL_TABLET | Freq: Two times a day (BID) | ORAL | Status: DC

## 2014-02-28 MED ORDER — METOPROLOL TARTRATE 50 MG PO TABS: 50.0000 mg | ORAL_TABLET | Freq: Two times a day (BID) | ORAL | Status: DC

## 2014-02-28 NOTE — Patient Instructions (Signed)
Your physician has recommended you make the following change in your medication:  1.) decrease metoprolol to 50 mg (1 tablet) twice daily 2.) start eliquis 5 mg twice daily  Your physician recommends that you return for lab work today.  (cbc, bmet, tsh)  Your physician has requested that you have an echocardiogram. Echocardiography is a painless test that uses sound waves to create images of your heart. It provides your doctor with information about the size and shape of your heart and how well your heart's chambers and valves are working. This procedure takes approximately one hour. There are no restrictions for this procedure.  ++++SCHEDULE FOR IN ABOUT 10 DAYS++++Your physician has recommended that you wear a holter monitor. Holter monitors are medical devices that record the heart's electrical activity. Doctors most often use these monitors to diagnose arrhythmias. Arrhythmias are problems with the speed or rhythm of the heartbeat. The monitor is a small, portable device. You can wear one while you do your normal daily activities. This is usually used to diagnose what is causing palpitations/syncope (passing out).  Your physician recommends that you schedule a follow-up appointment in: 2 months with Dr. Tenny Craw.

## 2014-02-28 NOTE — Progress Notes (Signed)
HPI Patient is an 80 year old with a history of CAD (s/p MI and PTCA), HTN and dyslipidemia.I saw him 1 year ago  SInce seen he has done well  He is exercising regularly  Dieting.   He denies palpitations.  No dizziness  No SOB  No CP    No Known Allergies  Current Outpatient Prescriptions  Medication Sig Dispense Refill  . amLODipine (NORVASC) 5 MG tablet TAKE 1 TABLET TWICE DAILY  180 tablet  4  . aspirin 81 MG tablet Take 81 mg by mouth daily.        Marland Kitchen atorvastatin (LIPITOR) 20 MG tablet TAKE 1 TABLET DAILY  90 tablet  4  . calcium carbonate (OS-CAL) 600 MG TABS tablet Take 600 mg by mouth 2 (two) times daily with a meal.      . losartan (COZAAR) 50 MG tablet Take 1 tablet (50 mg total) by mouth daily.  90 tablet  3  . metoprolol (LOPRESSOR) 50 MG tablet TAKE 1 AND 1/2 TABLETS TWICE DAILY  270 tablet  3  . multivitamin (THERAGRAN) per tablet Take 1 tablet by mouth daily.        . pantoprazole (PROTONIX) 40 MG tablet Take 40 mg by mouth daily.      . polyethylene glycol (MIRALAX / GLYCOLAX) packet Take 17 g by mouth daily. CLEARLAX      . vitamin C (ASCORBIC ACID) 500 MG tablet Take 500 mg by mouth daily.         No current facility-administered medications for this visit.    Past Medical History  Diagnosis Date  . Ischemic heart disease   . Obesity   . HTN (hypertension)   . Hyperlipidemia   . Hyperkalemia   . Abdominal aortic aneurysm 11/17/2009    diameter of 3.2  x 3.4 cm.    Past Surgical History  Procedure Laterality Date  . Angioplasty      He had remote angioplasty to the lab following a subendocardial anterior MI.    Family History  Problem Relation Age of Onset  . Congenital heart disease Son     History   Social History  . Marital Status: Married    Spouse Name: N/A    Number of Children: 1  . Years of Education: N/A   Occupational History  .     Social History Main Topics  . Smoking status: Never Smoker   . Smokeless tobacco: Not on file  .  Alcohol Use: Not on file  . Drug Use: Not on file  . Sexual Activity: Not on file   Other Topics Concern  . Not on file   Social History Narrative  . No narrative on file    Review of Systems:  All systems reviewed.  They are negative to the above problem except as previously stated.  Vital Signs: BP 138/76  Pulse 46  Ht  (1.676 m)  Wt 166 lb (75.297 kg)  BMI 26.81 kg/m2  Physical Exam Patient is in NAD HEENT:  Normocephalic, atraumatic. EOMI, PERRLA.  Neck: JVP is normal.  No bruits.  Lungs: clear to auscultation. No rales no wheezes.  Heart: Irregular rate and rhythm. Normal S1, S2. No S3.   No significant murmurs. PMI not displaced.  Abdomen:  Supple, nontender. Normal bowel sounds. No masses. No hepatomegaly.  Extremities:   Good distal pulses throughout. No lower extremity edema.  Musculoskeletal :moving all extremities.  Neuro:   alert and oriented x3.  CN  II-XII grossly intact.  EKG  Atrial fibrillation  42 bpm  LAFB  Occasional PVC  Assessment and Plan: 1  Atrial fb  New  Never documented before.  Patient is asymptomatic but has a slow rate Will decrease lopressor to 50 bid.  Start eliquis. Get labs  Get echo  Review with patient  Consider cardioversion after 1 month.  2.  CAD  Asymptomatic  Encouraged him to stay active  3.  HTn  Adequate control  3.  HL Continue lipitor.    F/U 1 year.

## 2014-02-28 NOTE — Telephone Encounter (Signed)
Informed to continue aspirin while on eliquis also. Per Dr. Tenny Craw pt has CAD also.

## 2014-02-28 NOTE — Telephone Encounter (Signed)
New message     Pt is not on eliquis.  Should he stop taking his aspirin?

## 2014-02-28 NOTE — Progress Notes (Signed)
Aorto-iliac artery duplex performed

## 2014-03-03 ENCOUNTER — Other Ambulatory Visit: Payer: Self-pay | Admitting: *Deleted

## 2014-03-03 MED ORDER — APIXABAN 5 MG PO TABS: 5.0000 mg | ORAL_TABLET | Freq: Two times a day (BID) | ORAL | Status: DC

## 2014-03-04 ENCOUNTER — Other Ambulatory Visit (HOSPITAL_COMMUNITY): Payer: Medicare Other

## 2014-03-06 ENCOUNTER — Telehealth: Payer: Self-pay | Admitting: Internal Medicine

## 2014-03-06 NOTE — Telephone Encounter (Signed)
New message    Wife calling   Patient has an  eye appt on Monday making sure the equipment that the ophthalmology  using will not effect his heart

## 2014-03-07 NOTE — Telephone Encounter (Signed)
Spoke with patient's wife. Pt getting holter on Monday.   Advised her a routine eye exam/appointment will not interfere with wearing this monitor. She verbalizes understanding.

## 2014-03-10 ENCOUNTER — Encounter: Payer: Self-pay | Admitting: *Deleted

## 2014-03-10 ENCOUNTER — Ambulatory Visit (HOSPITAL_COMMUNITY): Payer: Medicare Other | Attending: Internal Medicine | Admitting: Radiology

## 2014-03-10 ENCOUNTER — Encounter (INDEPENDENT_AMBULATORY_CARE_PROVIDER_SITE_OTHER): Payer: Medicare Other

## 2014-03-10 DIAGNOSIS — I252 Old myocardial infarction: Secondary | ICD-10-CM | POA: Diagnosis not present

## 2014-03-10 DIAGNOSIS — I4891 Unspecified atrial fibrillation: Secondary | ICD-10-CM | POA: Diagnosis not present

## 2014-03-10 DIAGNOSIS — I714 Abdominal aortic aneurysm, without rupture, unspecified: Secondary | ICD-10-CM | POA: Diagnosis not present

## 2014-03-10 DIAGNOSIS — I2510 Atherosclerotic heart disease of native coronary artery without angina pectoris: Secondary | ICD-10-CM | POA: Insufficient documentation

## 2014-03-10 DIAGNOSIS — I079 Rheumatic tricuspid valve disease, unspecified: Secondary | ICD-10-CM | POA: Diagnosis not present

## 2014-03-10 DIAGNOSIS — E669 Obesity, unspecified: Secondary | ICD-10-CM | POA: Diagnosis not present

## 2014-03-10 DIAGNOSIS — I10 Essential (primary) hypertension: Secondary | ICD-10-CM | POA: Insufficient documentation

## 2014-03-10 DIAGNOSIS — I2589 Other forms of chronic ischemic heart disease: Secondary | ICD-10-CM | POA: Insufficient documentation

## 2014-03-10 DIAGNOSIS — E785 Hyperlipidemia, unspecified: Secondary | ICD-10-CM | POA: Diagnosis not present

## 2014-03-10 NOTE — Progress Notes (Signed)
Echocardiogram performed.  

## 2014-03-10 NOTE — Progress Notes (Signed)
Patient ID: Gerald Wiggins, male   DOB: [birth year], 80 y.o.   MRN: [id number] Pt in the office today for Echocardiogram.  Echo tech Erie Noe) noted that the pt's pulse ran between 35-45 during test.  The pt is asymptomatic with bradycardia.  Discussed with Dr Excell Seltzer (DOD) and he advised the pt to decrease Metoprolol Tartrate to  twice a day and continue with heart monitor appointment. Pt verbalized understanding of medication instructions.

## 2014-03-10 NOTE — Progress Notes (Signed)
Patient ID: Gerald Wiggins, male   DOB: 1925-04-10, 80 y.o.   MRN: [id number] E-Cardio 48 hour holter monitor applied to patient.

## 2014-03-13 ENCOUNTER — Telehealth: Payer: Self-pay

## 2014-03-13 NOTE — Telephone Encounter (Signed)
Patient returned call for results of ECHO.  Informed patient of results and explained normal EF. Patient concerned about Dr. Excell Seltzerooper decreasing his Metoprolol dosage and wants Dr. Tenny Crawoss' input. Informed patient that his heart rate was 46 and Metoprolol can lower it even more, and that is why Dr. Excell Seltzerooper decreased dosage.  Patient still wants Dr. Tenny Crawoss' OK, so sending to Dr. Tenny Crawoss for review.

## 2014-03-13 NOTE — Telephone Encounter (Signed)
Called patient and discussed that echo was normal, even though his heart rate was slow.   Also informed that Dr. Tenny Crawoss is in agreement with decreasing the metoprolol to 1/2 pill twice a day as he was instructed for his slow rate. Pt verbalizes understanding and was appreciative of the call.

## 2014-03-14 ENCOUNTER — Telehealth: Payer: Self-pay

## 2014-03-14 NOTE — Telephone Encounter (Signed)
Spoke with patient's wife. She manages his medications. He already took 25 mg metoprolol this am. She will remove all other metoprolol from his pill boxes due to slow rate and short pauses. Advised her Dr. Tenny Crawoss will call once she reviews the study. Spouse verbalizes understanding and will inform patient.

## 2014-03-14 NOTE — Telephone Encounter (Signed)
Dennis from [employer] called to give 48 hour Holter report.  Patient wore monitor 9/28-9/29.  1st day, patient had 9 pauses greater than 3 seconds. Longest pause 3.3s. 2nd day, patient had 23 pauses greater than 3 seconds. Longest pause 4.27s.  Routing to Dr. Tenny Crawoss for review.

## 2014-03-14 NOTE — Telephone Encounter (Signed)
Stop metoprolol Wil need to review full holter. HR is overall slow .  Good thing is that has not been symtpomatic Will call patietn once i have reviewed full study.

## 2014-03-29 ENCOUNTER — Other Ambulatory Visit: Payer: Self-pay | Admitting: Internal Medicine

## 2014-03-29 NOTE — Telephone Encounter (Signed)
Rx was sent to pharmacy electronically. 

## 2014-04-08 ENCOUNTER — Telehealth: Payer: Self-pay | Admitting: *Deleted

## 2014-04-08 NOTE — Telephone Encounter (Signed)
Called patient to inform per Dr. Charlott Rakesoss's note:  After review, I would recomm stopping aspirin . Keep on Eliquis.   Pt informed. Verbalizes understanding and agreement.

## 2014-05-01 ENCOUNTER — Encounter: Payer: Self-pay | Admitting: Internal Medicine

## 2014-05-01 ENCOUNTER — Ambulatory Visit (INDEPENDENT_AMBULATORY_CARE_PROVIDER_SITE_OTHER): Payer: Medicare Other | Admitting: Internal Medicine

## 2014-05-01 VITALS — BP 120/82 | HR 80 | Ht 66.0 in | Wt 164.0 lb

## 2014-05-01 DIAGNOSIS — I10 Essential (primary) hypertension: Secondary | ICD-10-CM

## 2014-05-01 DIAGNOSIS — I4891 Unspecified atrial fibrillation: Secondary | ICD-10-CM

## 2014-05-01 DIAGNOSIS — E785 Hyperlipidemia, unspecified: Secondary | ICD-10-CM

## 2014-05-01 DIAGNOSIS — I2510 Atherosclerotic heart disease of native coronary artery without angina pectoris: Secondary | ICD-10-CM

## 2014-05-01 LAB — CBC
HEMATOCRIT: 47.6 % (ref 39.0–52.0)
Hemoglobin: 15.3 g/dL (ref 13.0–17.0)
MCHC: 32.2 g/dL (ref 30.0–36.0)
MCV: 88.8 fl (ref 78.0–100.0)
Platelets: 230 10*3/uL (ref 150.0–400.0)
RBC: 5.36 Mil/uL (ref 4.22–5.81)
RDW: 15.2 % (ref 11.5–15.5)
WBC: 6.9 10*3/uL (ref 4.0–10.5)

## 2014-05-01 LAB — TSH: TSH: 2 u[IU]/mL (ref 0.35–4.50)

## 2014-05-01 NOTE — Patient Instructions (Signed)
Your physician recommends that you continue on your current medications as directed. Please refer to the Current Medication list given to you today. Your physician recommends that you return for lab work today (CBC, BMET) Your physician recommends that you schedule a follow-up appointment in: 4 MONTHS WITH DR. Tenny CrawOSS.

## 2014-05-01 NOTE — Progress Notes (Signed)
HPI Patient is an 80 year old with a history of CAD (s/p MI and PTCA), HTN and dyslipidemia.I saw him 1 year ago  SInce seen he has done well  He is exercising regularly  Dieting.    I saw him earlier this fall  In afib    Started on anticoagulation   Set up for echo  HR noted to be slow  Set up for holter Echo LVEF 50 to 55%  Metoprolol stopped  Since seen he has done well  Denies CP  No dizziness  No SOB  Goes to gym each day No change in this    No Known Allergies  Current Outpatient Prescriptions  Medication Sig Dispense Refill  . amLODipine (NORVASC) 5 MG tablet TAKE 1 TABLET TWICE DAILY 180 tablet 4  . amoxicillin (AMOXIL) 500 MG capsule Take 500 mg by mouth 3 (three) times daily. Take 4 capsules prior to dental procedure    . atorvastatin (LIPITOR) 20 MG tablet TAKE 1 TABLET DAILY 90 tablet 4  . calcium carbonate (OS-CAL) 600 MG TABS tablet Take 600 mg by mouth daily.     Marland Kitchen. ELIQUIS 5 MG TABS tablet TAKE 1 TABLET BY MOUTH TWICE A DAY 60 tablet 5  . losartan (COZAAR) 50 MG tablet Take 1 tablet (50 mg total) by mouth daily. 90 tablet 3  . multivitamin (THERAGRAN) per tablet Take 1 tablet by mouth daily.      . pantoprazole (PROTONIX) 40 MG tablet Take 40 mg by mouth daily.    . polyethylene glycol (MIRALAX / GLYCOLAX) packet Take 17 g by mouth daily. CLEARLAX    . vitamin C (ASCORBIC ACID) 500 MG tablet Take 500 mg by mouth daily.       No current facility-administered medications for this visit.    Past Medical History  Diagnosis Date  . Ischemic heart disease   . Obesity   . HTN (hypertension)   . Hyperlipidemia   . Hyperkalemia   . Abdominal aortic aneurysm 11/17/2009    diameter of 3.2  x 3.4 cm.    Past Surgical History  Procedure Laterality Date  . Angioplasty      He had remote angioplasty to the lab following a subendocardial anterior MI.    Family History  Problem Relation Age of Onset  . Congenital heart disease Son   . Heart attack Neg Hx   . Stroke Neg  Hx     History   Social History  . Marital Status: Married    Spouse Name: N/A    Number of Children: 1  . Years of Education: N/A   Occupational History  .     Social History Main Topics  . Smoking status: Never Smoker   . Smokeless tobacco: Not on file  . Alcohol Use: Not on file  . Drug Use: Not on file  . Sexual Activity: Not on file   Other Topics Concern  . Not on file   Social History Narrative    Review of Systems:  All systems reviewed.  They are negative to the above problem except as previously stated.  Vital Signs: BP 120/82 mmHg  Pulse 80  Ht 5\' 6"  (1.676 m)  Wt 164 lb (74.39 kg)  BMI 26.48 kg/m2  SpO2 97%  Physical Exam Patient is in NAD HEENT:  Normocephalic, atraumatic. EOMI, PERRLA.  Neck: JVP is normal.  No bruits.  Lungs: clear to auscultation. No rales no wheezes.  Heart: Irregular rate and rhythm. Normal  S1, S2. No S3.   No significant murmurs. PMI not displaced.  Abdomen:  Supple, nontender. Normal bowel sounds. No masses. No hepatomegaly.  Extremities:   Good distal pulses throughout. No lower extremity edema.  Musculoskeletal :moving all extremities.  Neuro:   alert and oriented x3.  CN II-XII grossly intact.  EKG  Atrial fibrillation 65  Occasional PVC    Assessment and Plan: 1  Atrial fb Keep on current regimen  Check labs    2.  CAD  Asymptomatic  Encouraged him to stay active  3.  Bradycardia  Follow off of metoprolol  Asymptomatic.    4.  HTn  Adequate control  5.  HL  Keep on statin.    3.  HL Continue lipitor.    F/U 1 year.

## 2014-08-20 ENCOUNTER — Encounter: Payer: Self-pay | Admitting: Internal Medicine

## 2014-08-25 ENCOUNTER — Telehealth: Payer: Self-pay | Admitting: Internal Medicine

## 2014-08-25 DIAGNOSIS — E785 Hyperlipidemia, unspecified: Secondary | ICD-10-CM

## 2014-08-25 MED ORDER — ATORVASTATIN CALCIUM 40 MG PO TABS: ORAL_TABLET | ORAL | Status: DC

## 2014-08-25 NOTE — Telephone Encounter (Signed)
Result Note     LDL was 119 With hx of CAD it should be lower     I would incrase lipitor to 40 mg     Check lipid in 6 to 8 wks    Spoke with patient, informed. Appointment made for lipids in 6 weeks. New dose of lipitor ordered through [facility] per patient's request.

## 2014-08-25 NOTE — Telephone Encounter (Signed)
New Message ° ° °Patient is returning the nurses call. Please call back  °

## 2014-08-29 ENCOUNTER — Ambulatory Visit (INDEPENDENT_AMBULATORY_CARE_PROVIDER_SITE_OTHER): Payer: Medicare Other | Admitting: Internal Medicine

## 2014-08-29 ENCOUNTER — Encounter: Payer: Self-pay | Admitting: Internal Medicine

## 2014-08-29 VITALS — BP 130/60 | HR 71 | Ht 66.0 in | Wt 153.0 lb

## 2014-08-29 DIAGNOSIS — R55 Syncope and collapse: Secondary | ICD-10-CM

## 2014-08-29 NOTE — Patient Instructions (Signed)
Your physician recommends that you continue on your current medications as directed. Please refer to the Current Medication list given to you today.  Your physician has recommended that you wear a holter monitor. Holter monitors are medical devices that record the heart's electrical activity. Doctors most often use these monitors to diagnose arrhythmias. Arrhythmias are problems with the speed or rhythm of the heartbeat. The monitor is a small, portable device. You can wear one while you do your normal daily activities. This is usually used to diagnose what is causing palpitations/syncope (passing out).   

## 2014-08-29 NOTE — Progress Notes (Signed)
Cardiology Office Note   Date:  08/29/2014   ID:  Gerald EtienneWilliam C Omura, DOB 1925/01/17, MRN [id number]  PCP:   Duane Lopeoss, Alan, MD  Cardiologist:   Dietrich PatesPaula Ross, MD   No chief complaint on file.     History of Present Illness: Gerald Wiggins is a 81 y.o. male with a history ofCAD (s/p MI and PTCA), HTN and dyslipidemia.I saw him 1 year ago  SInce seen he has done well He is exercising regularly Dieting. Eats 1 grapefruit and 1 bowl of soup daily    I saw him earlier this fall In afib Started on anticoagulation Set up for echo HR noted to be slow Set up for holter Echo LVEF 50 to 55% Metoprolol stopped A couple weeks ago he had a syncopal spell Was in kitchen  Got light headed and then passed out transiently  No palpitations  No CP  When woke was feeling OK He thinks it was because of his extreme diet.   He has no dizzienss since  Active  Works out at gym 4x per week     Current Outpatient Prescriptions  Medication Sig Dispense Refill  . amLODipine (NORVASC) 5 MG tablet TAKE 1 TABLET TWICE DAILY 180 tablet 4  . amoxicillin (AMOXIL) 500 MG capsule Take 4 capsules prior to dental procedure    . atorvastatin (LIPITOR) 20 MG tablet Take 1 tablet by mouth 2 (two) times daily.    . calcium carbonate (OS-CAL) 600 MG TABS tablet Take 600 mg by mouth daily.     Marland Kitchen. ELIQUIS 5 MG TABS tablet TAKE 1 TABLET BY MOUTH TWICE A DAY 60 tablet 5  . losartan (COZAAR) 50 MG tablet Take 1 tablet (50 mg total) by mouth daily. 90 tablet 3  . multivitamin (THERAGRAN) per tablet Take 1 tablet by mouth daily.      . pantoprazole (PROTONIX) 40 MG tablet Take 40 mg by mouth daily.    . polyethylene glycol (MIRALAX / GLYCOLAX) packet Take 17 g by mouth daily. CLEARLAX    . vitamin C (ASCORBIC ACID) 500 MG tablet Take 500 mg by mouth daily.       No current facility-administered medications for this visit.    Allergies:   Review of patient's allergies indicates no known allergies.   Past Medical  History  Diagnosis Date  . Ischemic heart disease   . Obesity   . HTN (hypertension)   . Hyperlipidemia   . Hyperkalemia   . Abdominal aortic aneurysm 11/17/2009    diameter of 3.2  x 3.4 cm.    Past Surgical History  Procedure Laterality Date  . Angioplasty      He had remote angioplasty to the lab following a subendocardial anterior MI.     Social History:  The patient  reports that he has never smoked. He does not have any smokeless tobacco history on file.   Family History:  The patient's family history includes Congenital heart disease in his son. There is no history of Heart attack or Stroke.    ROS:  Please see the history of present illness. All other systems are reviewed and  Negative to the above problem except as noted.    PHYSICAL EXAM: VS:  BP 130/60 mmHg  Pulse 71  Ht 5\' 6"  (1.676 m)  Wt 153 lb (69.4 kg)  BMI 24.71 kg/m2  SpO2 98%  GEN: Well nourished, well developed, in no acute distress HEENT: normal Neck: no JVD, carotid bruits, or masses  Cardiac: RRR; no murmurs, rubs, or gallops,no edema  Respiratory:  clear to auscultation bilaterally, normal work of breathing GI: soft, nontender, nondistended, + BS  No hepatomegaly  MS: no deformity Moving all extremities   Skin: warm and dry, no rash Neuro:  Strength and sensation are intact Psych: euthymic mood, full affect   EKG:  EKG is ordered today.  Atrail fib 70 bpm  Occasional PVC     Lipid Panel    Component Value Date/Time   CHOL 169 02/21/2014 0743   CHOL 167 01/24/2013 0746   TRIG 105 02/21/2014 0743   TRIG 95.0 01/24/2013 0746   HDL 46 02/21/2014 0743   HDL 44.50 01/24/2013 0746   CHOLHDL 4 01/24/2013 0746   VLDL 19.0 01/24/2013 0746   LDLCALC 102* 02/21/2014 0743   LDLCALC 104* 01/24/2013 0746      Wt Readings from Last 3 Encounters:  08/29/14 153 lb (69.4 kg)  05/01/14 164 lb (74.39 kg)  02/28/14 166 lb (75.297 kg)      ASSESSMENT AND PLAN:  1.  Syncope  Concerning given  that he is on blood thinner especially  Is is eating poorly now and I told him this   I did not check orthostaticxs today because would not change anythng to my recomm to eat more Will set up for 24 hour holter  Encouraged him to increase his fluid and salt intake   He will call if he has any further spells  He should not be driving for now  2  CAD  No symtpoms to sugg angina  3.  Atrail fib  Continue on current regimen    4  HTN  Adequate control     Current medicines are reviewed at length with the patient today.  The patient does not have concerns regarding medicines.  The following changes have been made:   Labs/ tests ordered today include:HOlter   No orders of the defined types were placed in this encounter.      Signed, Dietrich Pates, MD  08/29/2014 8:40 AM    [facility] [address] [place], [place], [place]  [address] Phone: [phone]; Fax: [phone]

## 2014-08-30 ENCOUNTER — Encounter: Payer: Self-pay | Admitting: Internal Medicine

## 2014-09-01 ENCOUNTER — Encounter: Payer: Self-pay | Admitting: *Deleted

## 2014-09-01 ENCOUNTER — Encounter (INDEPENDENT_AMBULATORY_CARE_PROVIDER_SITE_OTHER): Payer: Medicare Other

## 2014-09-01 DIAGNOSIS — R55 Syncope and collapse: Secondary | ICD-10-CM | POA: Diagnosis not present

## 2014-09-01 NOTE — Progress Notes (Signed)
Patient ID: Gerald Wiggins, male   DOB: May 07, 1925, 81 y.o.   MRN: [id number] [employer] 24 hour holter monitor applied to patient.

## 2014-09-10 ENCOUNTER — Encounter: Payer: Self-pay | Admitting: Internal Medicine

## 2014-10-06 ENCOUNTER — Other Ambulatory Visit (INDEPENDENT_AMBULATORY_CARE_PROVIDER_SITE_OTHER): Payer: Medicare Other | Admitting: *Deleted

## 2014-10-06 DIAGNOSIS — E785 Hyperlipidemia, unspecified: Secondary | ICD-10-CM

## 2014-10-06 LAB — LIPID PANEL
CHOLESTEROL: 151 mg/dL (ref 0–200)
HDL: 50.4 mg/dL (ref >39.00)
LDL CALC: 83 mg/dL (ref 0–99)
NONHDL: 100.6
Total CHOL/HDL Ratio: 3
Triglycerides: 87 mg/dL (ref 0.0–149.0)
VLDL: 17.4 mg/dL (ref 0.0–40.0)

## 2014-10-06 NOTE — Addendum Note (Signed)
Addended by: BOWDEN, ROBIN K on: 10/06/2014 07:40 AM   Modules accepted: Orders  

## 2014-10-06 NOTE — Addendum Note (Signed)
Addended by: Jeny Nield K on: 10/06/2014 07:40 AM   Modules accepted: Orders  

## 2014-10-06 NOTE — Addendum Note (Signed)
Addended by: Tonita PhoenixBOWDEN, ROBIN K on: 10/06/2014 07:39 AM   Modules accepted: Orders

## 2014-12-08 ENCOUNTER — Other Ambulatory Visit: Payer: Self-pay

## 2015-02-23 ENCOUNTER — Other Ambulatory Visit: Payer: Self-pay | Admitting: Internal Medicine

## 2015-02-23 DIAGNOSIS — I714 Abdominal aortic aneurysm, without rupture, unspecified: Secondary | ICD-10-CM

## 2015-03-02 ENCOUNTER — Ambulatory Visit (HOSPITAL_COMMUNITY)
Admission: RE | Admit: 2015-03-02 | Discharge: 2015-03-02 | Disposition: A | Discharge status: Home / Self Care | Payer: Medicare Other | Source: Ambulatory Visit | Attending: Cardiovascular Disease | Admitting: Cardiovascular Disease

## 2015-03-02 DIAGNOSIS — I714 Abdominal aortic aneurysm, without rupture, unspecified: Secondary | ICD-10-CM

## 2015-03-02 DIAGNOSIS — E785 Hyperlipidemia, unspecified: Secondary | ICD-10-CM | POA: Insufficient documentation

## 2015-03-02 DIAGNOSIS — I10 Essential (primary) hypertension: Secondary | ICD-10-CM | POA: Insufficient documentation

## 2015-03-06 ENCOUNTER — Encounter: Payer: Self-pay | Admitting: Internal Medicine

## 2015-04-06 ENCOUNTER — Other Ambulatory Visit: Payer: Self-pay

## 2015-04-06 MED ORDER — APIXABAN 5 MG PO TABS: 5.0000 mg | ORAL_TABLET | Freq: Two times a day (BID) | ORAL | Status: DC

## 2015-09-26 ENCOUNTER — Other Ambulatory Visit: Payer: Self-pay | Admitting: Internal Medicine

## 2015-11-27 ENCOUNTER — Encounter: Payer: Self-pay | Admitting: Internal Medicine

## 2015-11-28 ENCOUNTER — Encounter: Payer: Self-pay | Admitting: Internal Medicine

## 2015-11-30 NOTE — Telephone Encounter (Signed)
Removed amoxicillin from patient's med list.

## 2015-12-04 ENCOUNTER — Ambulatory Visit (INDEPENDENT_AMBULATORY_CARE_PROVIDER_SITE_OTHER): Payer: Medicare Other | Admitting: Internal Medicine

## 2015-12-04 ENCOUNTER — Encounter: Payer: Self-pay | Admitting: Internal Medicine

## 2015-12-04 VITALS — BP 118/82 | HR 58 | Ht 66.0 in | Wt 161.8 lb

## 2015-12-04 DIAGNOSIS — I4891 Unspecified atrial fibrillation: Secondary | ICD-10-CM

## 2015-12-04 LAB — CBC WITH DIFFERENTIAL/PLATELET
BASOS ABS: 62 {cells}/uL (ref 0–200)
Basophils Relative: 1 %
EOS ABS: 124 {cells}/uL (ref 15–500)
Eosinophils Relative: 2 %
HEMATOCRIT: 46.8 % (ref 38.5–50.0)
HEMOGLOBIN: 15.7 g/dL (ref 13.2–17.1)
LYMPHS ABS: 1240 {cells}/uL (ref 850–3900)
Lymphocytes Relative: 20 %
MCH: 28.6 pg (ref 27.0–33.0)
MCHC: 33.5 g/dL (ref 32.0–36.0)
MCV: 85.2 fL (ref 80.0–100.0)
MONO ABS: 620 {cells}/uL (ref 200–950)
MPV: 11.6 fL (ref 7.5–12.5)
Monocytes Relative: 10 %
NEUTROS PCT: 67 %
Neutro Abs: 4154 cells/uL (ref 1500–7800)
Platelets: 192 10*3/uL (ref 140–400)
RBC: 5.49 MIL/uL (ref 4.20–5.80)
RDW: 14.4 % (ref 11.0–15.0)
WBC: 6.2 10*3/uL (ref 3.8–10.8)

## 2015-12-04 MED ORDER — APIXABAN 5 MG PO TABS: 5.0000 mg | ORAL_TABLET | Freq: Two times a day (BID) | ORAL | Status: DC

## 2015-12-04 NOTE — Patient Instructions (Signed)
Medication Instructions:  Your physician recommends that you continue on your current medications as directed. Please refer to the Current Medication list given to you today.  Labwork: cbc  Testing/Procedures: none  Follow-Up: Your physician wants you to follow-up in: 1 year ov You will receive a reminder letter in the mail two months in advance. If you don't receive a letter, please call our office to schedule the follow-up appointment.  If you need a refill on your cardiac medications before your next appointment, please call your pharmacy.

## 2015-12-04 NOTE — Progress Notes (Signed)
Cardiology Office Note   Date:  12/05/2015   ID:  Gerald EtienneWilliam C Cranston, DOB April 10, 1925, MRN [id number]  PCP:   Duane Lopeoss, Alan, MD  Cardiologist:   Dietrich PatesPaula Marquies Wanat, MD   Pt presents for f/u of CAD    History of Present Illness: Gerald Wiggins is a 82 y.o. male with a history ofCAD (s/p MI and PTCA), HTN and dyslipidemia., atrial fibrillation and bradycardia and syncope LVEF was 50 to 55% I saw him in March 2016  Since I saw him he has done well  NO CP  Breathing is OK  Works out 3x per wk     No dizziness  No syncope    Outpatient Prescriptions Prior to Visit  Medication Sig Dispense Refill  . atorvastatin (LIPITOR) 20 MG tablet Take 1 tablet by mouth 2 (two) times daily.    . calcium carbonate (OS-CAL) 600 MG TABS tablet Take 600 mg by mouth daily.     Marland Kitchen. losartan (COZAAR) 50 MG tablet Take 1 tablet (50 mg total) by mouth daily. 90 tablet 3  . multivitamin (THERAGRAN) per tablet Take 1 tablet by mouth daily.      . pantoprazole (PROTONIX) 40 MG tablet Take 40 mg by mouth daily.    . polyethylene glycol (MIRALAX / GLYCOLAX) packet Take 17 g by mouth daily. CLEARLAX    . vitamin C (ASCORBIC ACID) 500 MG tablet Take 500 mg by mouth daily.      Marland Kitchen. amLODipine (NORVASC) 5 MG tablet TAKE 1 TABLET TWICE DAILY 180 tablet 4  . apixaban (ELIQUIS) 5 MG TABS tablet Take 1 tablet (5 mg total) by mouth 2 (two) times daily. 180 tablet 0   No facility-administered medications prior to visit.     Allergies:   Review of patient's allergies indicates no known allergies.   Past Medical History  Diagnosis Date  . Ischemic heart disease   . Obesity   . HTN (hypertension)   . Hyperlipidemia   . Hyperkalemia   . Abdominal aortic aneurysm (HCC) 11/17/2009    diameter of 3.2  x 3.4 cm.    Past Surgical History  Procedure Laterality Date  . Angioplasty      He had remote angioplasty to the lab following a subendocardial anterior MI.     Social History:  The patient  reports that he has never  smoked. He does not have any smokeless tobacco history on file.   Family History:  The patient's family history includes Congenital heart disease in his son. There is no history of Heart attack or Stroke.    ROS:  Please see the history of present illness. All other systems are reviewed and  Negative to the above problem except as noted.    PHYSICAL EXAM: VS:  BP 118/82 mmHg  Pulse 58  Ht 5\' 6"  (1.676 m)  Wt 161 lb 12.8 oz (73.392 kg)  BMI 26.13 kg/m2  GEN: Well nourished, well developed, in no acute distress HEENT: normal Neck: no JVD, carotid bruits, or masses Cardiac: RRR; no murmurs, rubs, or gallops,no edema  Respiratory:  clear to auscultation bilaterally, normal work of breathing GI: soft, nontender, nondistended, + BS  No hepatomegaly  MS: no deformity Moving all extremities   Skin: warm and dry, no rash Neuro:  Strength and sensation are intact Psych: euthymic mood, full affect   EKG:  EKG is ordered today. Atrial fib 58 bpm     Lipid Panel    Component Value Date/Time  CHOL 151 10/06/2014 0740   CHOL 169 02/21/2014 0743   TRIG 87.0 10/06/2014 0740   TRIG 105 02/21/2014 0743   HDL 50.40 10/06/2014 0740   HDL 46 02/21/2014 0743   CHOLHDL 3 10/06/2014 0740   VLDL 17.4 10/06/2014 0740   LDLCALC 83 10/06/2014 0740   LDLCALC 102* 02/21/2014 0743      Wt Readings from Last 3 Encounters:  12/04/15 161 lb 12.8 oz (73.392 kg)  08/29/14 153 lb (69.4 kg)  05/01/14 164 lb (74.39 kg)      ASSESSMENT AND PLAN: 1  CAD  No symptomas to sugg angina  2.  Atrial fib  Continue rate control and Eliquis  3  HL  LDL was 101  It has been better  He will work on diet     Disposition:   FU with  in   Signed, Dietrich PatesPaula Brigett Estell, MD  12/05/2015 2:47 PM    [facility] [address], [place], [place]  [address] Phone: [phone]; Fax: [phone]

## 2015-12-08 ENCOUNTER — Telehealth: Payer: Self-pay | Admitting: Internal Medicine

## 2015-12-08 DIAGNOSIS — I2510 Atherosclerotic heart disease of native coronary artery without angina pectoris: Secondary | ICD-10-CM

## 2015-12-08 DIAGNOSIS — E785 Hyperlipidemia, unspecified: Secondary | ICD-10-CM

## 2015-12-08 NOTE — Telephone Encounter (Signed)
Follow-up      The pt is returning the nurses call, about lab results

## 2015-12-08 NOTE — Telephone Encounter (Signed)
Pt notified of lab results and is agreeable to repeat labs 10/2.Marland Kitchen.order placed in epic and appt made..Pt agreeable to work on diet.

## 2015-12-14 ENCOUNTER — Other Ambulatory Visit: Payer: Self-pay | Admitting: *Deleted

## 2015-12-14 DIAGNOSIS — E785 Hyperlipidemia, unspecified: Secondary | ICD-10-CM

## 2016-03-14 ENCOUNTER — Other Ambulatory Visit: Payer: Medicare Other | Admitting: *Deleted

## 2016-03-14 DIAGNOSIS — E785 Hyperlipidemia, unspecified: Secondary | ICD-10-CM

## 2016-03-14 DIAGNOSIS — I2510 Atherosclerotic heart disease of native coronary artery without angina pectoris: Secondary | ICD-10-CM

## 2016-03-14 LAB — COMPREHENSIVE METABOLIC PANEL
ALBUMIN: 4.1 g/dL (ref 3.6–5.1)
ALK PHOS: 77 U/L (ref 40–115)
ALT: 27 U/L (ref 9–46)
AST: 27 U/L (ref 10–35)
BILIRUBIN TOTAL: 1 mg/dL (ref 0.2–1.2)
BUN: 17 mg/dL (ref 7–25)
CALCIUM: 9.7 mg/dL (ref 8.6–10.3)
CO2: 27 mmol/L (ref 20–31)
Chloride: 101 mmol/L (ref 98–110)
Creat: 1.01 mg/dL (ref 0.70–1.11)
GLUCOSE: 125 mg/dL — AB (ref 65–99)
POTASSIUM: 4.4 mmol/L (ref 3.5–5.3)
Sodium: 139 mmol/L (ref 135–146)
TOTAL PROTEIN: 6.9 g/dL (ref 6.1–8.1)

## 2016-03-14 LAB — LIPID PANEL
CHOL/HDL RATIO: 3 ratio (ref <=5.0)
CHOLESTEROL: 142 mg/dL (ref 125–200)
HDL: 47 mg/dL (ref >=40)
LDL Cholesterol: 77 mg/dL (ref <130)
TRIGLYCERIDES: 91 mg/dL (ref <150)
VLDL: 18 mg/dL (ref <30)

## 2016-07-18 ENCOUNTER — Telehealth: Payer: Self-pay | Admitting: Internal Medicine

## 2016-07-18 NOTE — Telephone Encounter (Signed)
New Message    Request for surgical clearance:  1. What type of surgery is being performed? Replacing liner for polywear for rt hip   2. When is this surgery scheduled?  asap   3. Are there any medications that need to be held prior to surgery and how long? Eliquis and medical clearance   4. Name of physician performing surgery? Dr Turner Danielsowan   5. What is your office phone and fax number? [phone] fax [phone]

## 2016-07-18 NOTE — Telephone Encounter (Signed)
Received written request for clearance for patient's surgery.  Signed by Dr. Tenny Crawoss.  Placed in nurse fax bin in medical records to be faxed.

## 2016-07-19 ENCOUNTER — Telehealth: Payer: Self-pay | Admitting: Internal Medicine

## 2016-07-19 NOTE — Telephone Encounter (Signed)
Informed patient's wife Dr. Tenny Crawoss completed surgical clearance and it will be faxed today from medical records.

## 2016-07-19 NOTE — Telephone Encounter (Signed)
New Message     Did you receive email from [facility], about [place] procedure

## 2016-07-22 ENCOUNTER — Other Ambulatory Visit: Payer: Self-pay | Admitting: Orthopedic Surgery

## 2016-08-02 ENCOUNTER — Encounter (HOSPITAL_COMMUNITY)
Admission: RE | Admit: 2016-08-02 | Discharge: 2016-08-02 | Disposition: A | Discharge status: Home / Self Care | Payer: Medicare Other | Source: Ambulatory Visit | Attending: Orthopedic Surgery | Admitting: Orthopedic Surgery

## 2016-08-02 ENCOUNTER — Ambulatory Visit (HOSPITAL_COMMUNITY)
Admission: RE | Admit: 2016-08-02 | Discharge: 2016-08-02 | Disposition: A | Discharge status: Home / Self Care | Payer: Medicare Other | Source: Ambulatory Visit | Attending: Orthopedic Surgery | Admitting: Orthopedic Surgery

## 2016-08-02 ENCOUNTER — Encounter (HOSPITAL_COMMUNITY): Payer: Self-pay

## 2016-08-02 DIAGNOSIS — I700 Atherosclerosis of aorta: Secondary | ICD-10-CM | POA: Diagnosis not present

## 2016-08-02 DIAGNOSIS — Z01818 Encounter for other preprocedural examination: Secondary | ICD-10-CM | POA: Diagnosis present

## 2016-08-02 DIAGNOSIS — R918 Other nonspecific abnormal finding of lung field: Secondary | ICD-10-CM | POA: Diagnosis not present

## 2016-08-02 HISTORY — DX: Atherosclerotic heart disease of native coronary artery without angina pectoris: I25.10

## 2016-08-02 HISTORY — DX: Acute myocardial infarction, unspecified: I21.9

## 2016-08-02 HISTORY — DX: Cardiac arrhythmia, unspecified: I49.9

## 2016-08-02 LAB — URINALYSIS, MICROSCOPIC (REFLEX)

## 2016-08-02 LAB — CBC WITH DIFFERENTIAL/PLATELET
Basophils Absolute: 0 10*3/uL (ref 0.0–0.1)
Basophils Relative: 0 %
EOS ABS: 0.1 10*3/uL (ref 0.0–0.7)
EOS PCT: 2 %
HCT: 46.3 % (ref 39.0–52.0)
Hemoglobin: 15.6 g/dL (ref 13.0–17.0)
LYMPHS ABS: 1.3 10*3/uL (ref 0.7–4.0)
LYMPHS PCT: 19 %
MCH: 29 pg (ref 26.0–34.0)
MCHC: 33.7 g/dL (ref 30.0–36.0)
MCV: 86.1 fL (ref 78.0–100.0)
MONO ABS: 0.6 10*3/uL (ref 0.1–1.0)
Monocytes Relative: 9 %
Neutro Abs: 4.6 10*3/uL (ref 1.7–7.7)
Neutrophils Relative %: 70 %
PLATELETS: 189 10*3/uL (ref 150–400)
RBC: 5.38 MIL/uL (ref 4.22–5.81)
RDW: 14.8 % (ref 11.5–15.5)
WBC: 6.6 10*3/uL (ref 4.0–10.5)

## 2016-08-02 LAB — URINALYSIS, ROUTINE W REFLEX MICROSCOPIC
BILIRUBIN URINE: NEGATIVE
Glucose, UA: NEGATIVE mg/dL
Ketones, ur: NEGATIVE mg/dL
NITRITE: NEGATIVE
Protein, ur: 30 mg/dL — AB
SPECIFIC GRAVITY, URINE: 1.015 (ref 1.005–1.030)
pH: 6 (ref 5.0–8.0)

## 2016-08-02 LAB — BASIC METABOLIC PANEL
Anion gap: 11 (ref 5–15)
BUN: 17 mg/dL (ref 6–20)
CHLORIDE: 102 mmol/L (ref 101–111)
CO2: 26 mmol/L (ref 22–32)
CREATININE: 0.87 mg/dL (ref 0.61–1.24)
Calcium: 9.9 mg/dL (ref 8.9–10.3)
GFR calc Af Amer: >60 mL/min (ref >60)
GLUCOSE: 112 mg/dL — AB (ref 65–99)
POTASSIUM: 4 mmol/L (ref 3.5–5.1)
Sodium: 139 mmol/L (ref 135–145)

## 2016-08-02 LAB — PROTIME-INR
INR: 1.32
PROTHROMBIN TIME: 16.5 s — AB (ref 11.4–15.2)

## 2016-08-02 LAB — TYPE AND SCREEN
ABO/RH(D): A POS
Antibody Screen: NEGATIVE

## 2016-08-02 LAB — SURGICAL PCR SCREEN
MRSA, PCR: NEGATIVE
Staphylococcus aureus: NEGATIVE

## 2016-08-02 LAB — APTT: APTT: 37 s — AB (ref 24–36)

## 2016-08-02 LAB — ABO/RH: ABO/RH(D): A POS

## 2016-08-02 NOTE — Pre-Procedure Instructions (Addendum)
Gerald Wiggins  08/02/2016      [facility] [place], [place] - [address] [place] [address] Phone: [phone] Fax: [phone]  [id number] [address]- [place], [place] - [address] [place] [address] Phone: [phone] Fax: [phone]    Your procedure is scheduled on Monday, February 26th   [facility] at 10:30 AM             (posted surgery time 12:32 pm - 2:59 pm)   Call this number if you have problems the MORNING of surgery:  [phone].  Short Stay Center is NOT open on weekends.   Remember:  Do not eat food or drink liquids after midnight Sunday.   Take these medicines the morning of surgery with A SIP OF WATER : Amlodipine    08/02/16          7 days prior to surgery, STOP taking any Vitamins, Herbal Supplements, Anti-inflammatories.,aspirin              Stop taking Eliquis per dr 08/05/16   Do not wear jewelry - no rings or watches.  Do not wear lotions, colognes or deoderant.             Men may shave face and neck.   Do not bring valuables to the hospital.  [facility] is not responsible for any belongings or valuables.  Contacts, dentures or bridgework may not be worn into surgery.  Leave your suitcase in the car.  After surgery it may be brought to your room. For patients admitted to the hospital, discharge time will be determined by your treatment team.  Special instructions:    Please read over the  fact sheets that you were given.

## 2016-08-02 NOTE — Progress Notes (Signed)
Anesthesia Chart Review:  Pt is an 83 year old male scheduled for right total hip revision on 08/08/2016 with Gean BirchwoodFrank Rowan, M.D.  - PCP is C. Duane LopeAlan Ross, MD.  - Cardiologist is Dietrich PatesPaula Ross, MD, who has cleared pt for surgery. last office visit 12/04/15.   PMH includes: CAD (hx MI and PTCA (no records)), atrial fibrillation, HTN, hyperlipidemia, AAA. Never smoker. BMI 25.  Medications include: Amlodipine, Eliquis, Lipitor, losartan. Pt to stop eliquis 08/05/16.   Preoperative labs reviewed.   - Pt 16.5, PTT 37. Will repeat DOS.  - UA with RBCs too numerous to count.  [facility] in Dr. Wadie Lessenowan's office about hematuria. I spoke with pt as well about hematuria; he denies obvious hematuria. I will forward result to PCP.   CXR 08/02/16:   -No infiltrate or congestive heart failure. - Biliary nodularity peripheral aspect right lung may represent chronic changes possibly from aspiration. - Probable prominent left nipple shadow and anterior aspect left 6 rib causing slightly nodular appearance left lung base. - Calcified significantly tortuous aorta.  EKG 12/04/15: Atrial fibrillation with slow ventricular response (58 bpm). Nonspecific ST abnormality.  AAA duplex 03/02/15:  - Stable dimensions of the infrarenal fusiform AAA measuring 3.2 cm x 3.6 cm.  - Stable dilation of the bilateral common iliac arteries measuring 1.4 cm x 1.6 cm, bilaterally. - Normal caliber bilateral external iliac arteries. - Patent IVC.  Holter monitor 09/01/14: Persistent A. fib with occasional RVR. Bradycardia 28 bpm presumably during sleep.  Echo 03/10/14:  - Left ventricle: The cavity size was normal. Systolic function wasnormal. The estimated ejection fraction was in the range of 50%to 55%. - Aortic valve: There was mild regurgitation. - Mitral valve: There was mild regurgitation. - Left atrium: The atrium was moderately dilated. - Pulmonary arteries: PA peak pressure: 39 mm Hg (S).  Nuclear stress  test 06/02/09: Myocardial perfusion imaging is normal. Overall LV function is normal without regional wall motion abnormalities.  If no changes, I anticipate pt can proceed with surgery as scheduled.   Rica Mastngela Kabbe, FNP-BC [facility] Phone: [phone] 08/02/2016 5:18 PM

## 2016-08-03 ENCOUNTER — Telehealth: Payer: Self-pay | Admitting: Internal Medicine

## 2016-08-03 DIAGNOSIS — I714 Abdominal aortic aneurysm, without rupture, unspecified: Secondary | ICD-10-CM

## 2016-08-03 NOTE — Telephone Encounter (Signed)
Pt had duplex scan in Sept 2016  The aorta and iliac arteries were very mildly dilated Rec was for f/u in one year   This should not interfere with planned hip surgery  He can have it done after

## 2016-08-03 NOTE — Telephone Encounter (Signed)
Will forward to Dr. Tenny Crawoss for any recommendations/advice.

## 2016-08-03 NOTE — Telephone Encounter (Signed)
Patient is calling states that he has a operation to repair his hip replacement on Monday 08/08/16. He went to an pre-admission evaluation yesterday and they informed him that his last aneurysm exam was 03-02-14. His question is "how does this (the fact that his last aneurysm exam was in 2015) affect my pending operation on Monday, 08/08/16?"  He also states that his orthopedic surgeon and Dr. Tenny Crawoss have been in contact. Please call, thanks.

## 2016-08-04 NOTE — Telephone Encounter (Signed)
Called patient and provided Dr. Charlott Rakesoss's recommendations that he can have his surgery and his mildly elevated iliac and aorta were very mildly dilated.  He is aware we will call him in about a month to schedule follow up AAA duplex.  Very appreciative for the information provided.

## 2016-08-05 ENCOUNTER — Ambulatory Visit (HOSPITAL_COMMUNITY)
Admission: RE | Admit: 2016-08-05 | Discharge: 2016-08-05 | Disposition: A | Discharge status: Home / Self Care | Payer: Medicare Other | Source: Ambulatory Visit | Attending: Internal Medicine | Admitting: Internal Medicine

## 2016-08-05 DIAGNOSIS — E785 Hyperlipidemia, unspecified: Secondary | ICD-10-CM | POA: Insufficient documentation

## 2016-08-05 DIAGNOSIS — I2510 Atherosclerotic heart disease of native coronary artery without angina pectoris: Secondary | ICD-10-CM

## 2016-08-05 DIAGNOSIS — I714 Abdominal aortic aneurysm, without rupture, unspecified: Secondary | ICD-10-CM

## 2016-08-05 DIAGNOSIS — I10 Essential (primary) hypertension: Secondary | ICD-10-CM

## 2016-08-05 MED ORDER — DEXTROSE-NACL 5-0.45 % IV SOLN: INTRAVENOUS | Status: DC

## 2016-08-05 MED ORDER — CEFAZOLIN SODIUM-DEXTROSE 2-4 GM/100ML-% IV SOLN
2.0000 g | INTRAVENOUS | Status: AC
  Administered 2016-08-08: 2 g via INTRAVENOUS
  Filled 2016-08-05: qty 100

## 2016-08-05 MED ORDER — TRANEXAMIC ACID 1000 MG/10ML IV SOLN
2000.0000 mg | INTRAVENOUS | Status: AC
  Administered 2016-08-08: 2000 mg via TOPICAL
  Filled 2016-08-05: qty 20

## 2016-08-05 NOTE — H&P (Signed)
TOTAL HIP REVISION ADMISSION H&P  Patient is admitted for right revision total hip arthroplasty.  Subjective:  Chief Complaint: right hip pain  HPI: Gerald Wiggins, 83 y.o. male, has a history of pain and functional disability in the right hip due to arthritis and patient has failed non-surgical conservative treatments for greater than 12 weeks to include NSAID's and/or analgesics, flexibility and strengthening excercises, use of assistive devices and activity modification. The indications for the revision total hip arthroplasty are bearing surface wear leading to  symptomatic synovitis and implant or hip misalignment.  Onset of symptoms was gradual starting 2 years ago with gradually worsening course since that time.  Prior procedures on the right hip include arthroplasty.  Patient currently rates pain in the right hip at 10 out of 10 with activity.  There is night pain, worsening of pain with activity and weight bearing, trendelenberg gait, pain that interfers with activities of daily living and pain with passive range of motion. Patient has evidence of joint space narrowing by imaging studies.  This condition presents safety issues increasing the risk of falls. There is no current active infection.  Patient Active Problem List   Diagnosis Date Noted  . Coronary artery disease 11/22/2010  . Dyslipidemia 11/22/2010  . Hypertension 11/22/2010   Past Medical History:  Diagnosis Date  . Abdominal aortic aneurysm (HCC) 11/17/2009   diameter of 3.2  x 3.4 cm.  . Coronary artery disease   . Dysrhythmia    atrial fib  . HTN (hypertension)   . Hyperkalemia   . Hyperlipidemia   . Ischemic heart disease   . Myocardial infarction   . Obesity     Past Surgical History:  Procedure Laterality Date  . ANGIOPLASTY  1988   He had remote angioplasty to the lab following a subendocardial anterior MI.  Marland Kitchen APPENDECTOMY    . BACK SURGERY    . JOINT REPLACEMENT Right 1988   hip  . TONSILLECTOMY       No prescriptions prior to admission.   No Known Allergies  Social History  Substance Use Topics  . Smoking status: Never Smoker  . Smokeless tobacco: Never Used  . Alcohol use Yes     Comment: occ    Family History  Problem Relation Age of Onset  . Congenital heart disease Son   . Heart attack Neg Hx   . Stroke Neg Hx       Review of Systems  Constitutional: Negative.   HENT: Negative.   Eyes: Negative.   Respiratory: Negative.   Cardiovascular:       HTN  Gastrointestinal: Negative.   Genitourinary: Positive for hematuria.  Musculoskeletal: Positive for joint pain.  Skin: Negative.   Neurological: Negative.   Endo/Heme/Allergies: Negative.   Psychiatric/Behavioral: Negative.     Objective:  Physical Exam  Constitutional: He is oriented to person, place, and time. He appears well-developed and well-nourished.  HENT:  Head: Normocephalic and atraumatic.  Neck: Normal range of motion. Neck supple.  Cardiovascular: Intact distal pulses.   Respiratory: Effort normal.  Musculoskeletal:  Patient walks with a right-sided limp.  Internal rotation does cause irritability in the right hip.  There is no occasional sensation of crepitus palpating through the greater trochanter with motion.  Foot tap is negative.  Neurological: He is alert and oriented to person, place, and time.  Skin: Skin is warm and dry.  Psychiatric: He has a normal mood and affect. His behavior is normal. Judgment and thought content  normal.    Vital signs in last 24 hours:     Labs:   Estimated body mass index is 25.13 kg/m as calculated from the following:   Height as of 08/02/16: 5\' 6"  (1.676 m).   Weight as of 08/02/16: 70.6 kg (155 lb 11.2 oz).  Imaging Review:  Plain radiographs demonstrate  AP and crosstable lateral show severe polyethylene wear of the liner.  The locking ring is actually fragmented in one area.  Assessment/Plan:  Worn out polyethylene liner hybrid right total hip  arthroplasty that was placed 20 years ago with a DePuy Duraloc shell and Summit stem polyethylene liner and metal head.  The patient history, physical examination, clinical judgement of the provider and imaging studies are consistent with end stage degenerative joint disease of the right hip(s), previous total hip arthroplasty. Revision total hip arthroplasty is deemed medically necessary. The treatment options including medical management, injection therapy, arthroscopy and arthroplasty were discussed at length. The risks and benefits of total hip arthroplasty were presented and reviewed. The risks due to aseptic loosening, infection, stiffness, dislocation/subluxation,  thromboembolic complications and other imponderables were discussed.  The patient acknowledged the explanation, agreed to proceed with the plan and consent was signed. Patient is being admitted for inpatient treatment for surgery, pain control, PT, OT, prophylactic antibiotics, VTE prophylaxis, progressive ambulation and ADL's and discharge planning. The patient is planning to be discharged to skilled nursing facility

## 2016-08-06 ENCOUNTER — Encounter: Payer: Self-pay | Admitting: Internal Medicine

## 2016-08-06 DIAGNOSIS — T84068A Wear of articular bearing surface of other internal prosthetic joint, initial encounter: Secondary | ICD-10-CM

## 2016-08-06 DIAGNOSIS — Z96649 Presence of unspecified artificial hip joint: Secondary | ICD-10-CM

## 2016-08-07 MED ORDER — BUPIVACAINE LIPOSOME 1.3 % IJ SUSP
20.0000 mL | Freq: Once | INTRAMUSCULAR | Status: DC
  Filled 2016-08-07: qty 20

## 2016-08-07 MED ORDER — TRANEXAMIC ACID 1000 MG/10ML IV SOLN
1000.0000 mg | INTRAVENOUS | Status: AC
  Administered 2016-08-08: 1000 mg via INTRAVENOUS
  Filled 2016-08-07: qty 10

## 2016-08-08 ENCOUNTER — Inpatient Hospital Stay (HOSPITAL_COMMUNITY): Payer: Medicare Other | Admitting: Emergency Medicine

## 2016-08-08 ENCOUNTER — Encounter (HOSPITAL_COMMUNITY)
Admission: RE | Disposition: A | Discharge status: Home Health | Payer: Self-pay | Source: Ambulatory Visit | Attending: Orthopedic Surgery

## 2016-08-08 ENCOUNTER — Inpatient Hospital Stay (HOSPITAL_COMMUNITY): Payer: Medicare Other

## 2016-08-08 ENCOUNTER — Inpatient Hospital Stay (HOSPITAL_COMMUNITY)
Admission: RE | Admit: 2016-08-08 | Discharge: 2016-08-10 | DRG: 468 | Disposition: A | Discharge status: Home Health | Payer: Medicare Other | Source: Ambulatory Visit | Attending: Orthopedic Surgery | Admitting: Orthopedic Surgery

## 2016-08-08 ENCOUNTER — Encounter (HOSPITAL_COMMUNITY): Payer: Self-pay | Admitting: Certified Registered"

## 2016-08-08 ENCOUNTER — Inpatient Hospital Stay (HOSPITAL_COMMUNITY): Payer: Medicare Other | Admitting: Anesthesiology

## 2016-08-08 DIAGNOSIS — Z96649 Presence of unspecified artificial hip joint: Secondary | ICD-10-CM

## 2016-08-08 DIAGNOSIS — Y92009 Unspecified place in unspecified non-institutional (private) residence as the place of occurrence of the external cause: Secondary | ICD-10-CM

## 2016-08-08 DIAGNOSIS — Y838 Other surgical procedures as the cause of abnormal reaction of the patient, or of later complication, without mention of misadventure at the time of the procedure: Secondary | ICD-10-CM | POA: Diagnosis present

## 2016-08-08 DIAGNOSIS — I252 Old myocardial infarction: Secondary | ICD-10-CM | POA: Diagnosis not present

## 2016-08-08 DIAGNOSIS — T84068A Wear of articular bearing surface of other internal prosthetic joint, initial encounter: Secondary | ICD-10-CM

## 2016-08-08 DIAGNOSIS — E785 Hyperlipidemia, unspecified: Secondary | ICD-10-CM | POA: Diagnosis present

## 2016-08-08 DIAGNOSIS — I10 Essential (primary) hypertension: Secondary | ICD-10-CM | POA: Diagnosis present

## 2016-08-08 DIAGNOSIS — I2510 Atherosclerotic heart disease of native coronary artery without angina pectoris: Secondary | ICD-10-CM | POA: Diagnosis present

## 2016-08-08 DIAGNOSIS — T84060A Wear of articular bearing surface of internal prosthetic right hip joint, initial encounter: Secondary | ICD-10-CM | POA: Diagnosis present

## 2016-08-08 HISTORY — PX: TOTAL HIP REVISION: SHX763

## 2016-08-08 LAB — APTT: aPTT: 31 seconds (ref 24–36)

## 2016-08-08 LAB — PROTIME-INR
INR: 1.1
PROTHROMBIN TIME: 14.2 s (ref 11.4–15.2)

## 2016-08-08 SURGERY — TOTAL HIP REVISION
Anesthesia: Monitor Anesthesia Care | Laterality: Right

## 2016-08-08 MED ORDER — METOCLOPRAMIDE HCL 5 MG PO TABS
5.0000 mg | ORAL_TABLET | Freq: Three times a day (TID) | ORAL | Status: DC | PRN
Start: 2016-08-08 — End: 2016-08-10

## 2016-08-08 MED ORDER — PHENYLEPHRINE 40 MCG/ML (10ML) SYRINGE FOR IV PUSH (FOR BLOOD PRESSURE SUPPORT)
PREFILLED_SYRINGE | INTRAVENOUS | Status: AC
  Filled 2016-08-08: qty 10

## 2016-08-08 MED ORDER — APIXABAN 2.5 MG PO TABS: 2.5000 mg | ORAL_TABLET | Freq: Two times a day (BID) | ORAL | 0 refills | Status: DC

## 2016-08-08 MED ORDER — PROPOFOL 10 MG/ML IV BOLUS
INTRAVENOUS | Status: DC | PRN
Start: 2016-08-08 — End: 2016-08-08
  Administered 2016-08-08: 10 mg via INTRAVENOUS

## 2016-08-08 MED ORDER — METHOCARBAMOL 500 MG PO TABS
500.0000 mg | ORAL_TABLET | Freq: Four times a day (QID) | ORAL | Status: DC | PRN
  Administered 2016-08-08 – 2016-08-09 (×2): 500 mg via ORAL
  Filled 2016-08-08: qty 1

## 2016-08-08 MED ORDER — MENTHOL 3 MG MT LOZG: 1.0000 | LOZENGE | OROMUCOSAL | Status: DC | PRN

## 2016-08-08 MED ORDER — BISACODYL 5 MG PO TBEC: 5.0000 mg | DELAYED_RELEASE_TABLET | Freq: Every day | ORAL | Status: DC | PRN

## 2016-08-08 MED ORDER — ATORVASTATIN CALCIUM 40 MG PO TABS
40.0000 mg | ORAL_TABLET | Freq: Every morning | ORAL | Status: DC
  Administered 2016-08-09 – 2016-08-10 (×2): 40 mg via ORAL
  Filled 2016-08-08 (×2): qty 1

## 2016-08-08 MED ORDER — PHENYLEPHRINE 40 MCG/ML (10ML) SYRINGE FOR IV PUSH (FOR BLOOD PRESSURE SUPPORT)
PREFILLED_SYRINGE | INTRAVENOUS | Status: DC | PRN
  Administered 2016-08-08 (×3): 80 ug via INTRAVENOUS

## 2016-08-08 MED ORDER — HYDROCODONE-ACETAMINOPHEN 5-325 MG PO TABS: 1.0000 | ORAL_TABLET | ORAL | 0 refills | Status: DC | PRN

## 2016-08-08 MED ORDER — LACTATED RINGERS IV SOLN
INTRAVENOUS | Status: DC
  Administered 2016-08-08 (×2): via INTRAVENOUS

## 2016-08-08 MED ORDER — ONDANSETRON HCL 4 MG PO TABS: 4.0000 mg | ORAL_TABLET | Freq: Four times a day (QID) | ORAL | Status: DC | PRN

## 2016-08-08 MED ORDER — METOCLOPRAMIDE HCL 5 MG/ML IJ SOLN: 5.0000 mg | Freq: Three times a day (TID) | INTRAMUSCULAR | Status: DC | PRN

## 2016-08-08 MED ORDER — FENTANYL CITRATE (PF) 100 MCG/2ML IJ SOLN
INTRAMUSCULAR | Status: AC
  Filled 2016-08-08: qty 2

## 2016-08-08 MED ORDER — ALUM & MAG HYDROXIDE-SIMETH 200-200-20 MG/5ML PO SUSP: 30.0000 mL | ORAL | Status: DC | PRN

## 2016-08-08 MED ORDER — DIPHENHYDRAMINE HCL 12.5 MG/5ML PO ELIX: 12.5000 mg | ORAL_SOLUTION | ORAL | Status: DC | PRN

## 2016-08-08 MED ORDER — HYDROMORPHONE HCL 2 MG/ML IJ SOLN: 0.5000 mg | INTRAMUSCULAR | Status: DC | PRN

## 2016-08-08 MED ORDER — FLEET ENEMA 7-19 GM/118ML RE ENEM: 1.0000 | ENEMA | Freq: Once | RECTAL | Status: DC | PRN

## 2016-08-08 MED ORDER — TIZANIDINE HCL 2 MG PO TABS: 2.0000 mg | ORAL_TABLET | Freq: Four times a day (QID) | ORAL | 0 refills | Status: DC | PRN

## 2016-08-08 MED ORDER — SENNOSIDES-DOCUSATE SODIUM 8.6-50 MG PO TABS: 1.0000 | ORAL_TABLET | Freq: Every evening | ORAL | Status: DC | PRN

## 2016-08-08 MED ORDER — BUPIVACAINE-EPINEPHRINE 0.5% -1:200000 IJ SOLN
INTRAMUSCULAR | Status: DC | PRN
  Administered 2016-08-08: 30 mL

## 2016-08-08 MED ORDER — ACETAMINOPHEN 325 MG PO TABS: 650.0000 mg | ORAL_TABLET | Freq: Four times a day (QID) | ORAL | Status: DC | PRN

## 2016-08-08 MED ORDER — DEXAMETHASONE SODIUM PHOSPHATE 10 MG/ML IJ SOLN
10.0000 mg | Freq: Once | INTRAMUSCULAR | Status: AC
  Administered 2016-08-09: 10 mg via INTRAVENOUS
  Filled 2016-08-08: qty 1

## 2016-08-08 MED ORDER — KCL IN DEXTROSE-NACL 20-5-0.45 MEQ/L-%-% IV SOLN
INTRAVENOUS | Status: DC
  Administered 2016-08-08 – 2016-08-09 (×2): via INTRAVENOUS
  Filled 2016-08-08 (×3): qty 1000

## 2016-08-08 MED ORDER — MIDAZOLAM HCL 2 MG/2ML IJ SOLN
INTRAMUSCULAR | Status: AC
  Filled 2016-08-08: qty 2

## 2016-08-08 MED ORDER — DOCUSATE SODIUM 100 MG PO CAPS
100.0000 mg | ORAL_CAPSULE | Freq: Two times a day (BID) | ORAL | Status: DC
  Administered 2016-08-08 – 2016-08-10 (×4): 100 mg via ORAL
  Filled 2016-08-08 (×4): qty 1

## 2016-08-08 MED ORDER — FENTANYL CITRATE (PF) 100 MCG/2ML IJ SOLN: 25.0000 ug | INTRAMUSCULAR | Status: DC | PRN

## 2016-08-08 MED ORDER — ONDANSETRON HCL 4 MG/2ML IJ SOLN: 4.0000 mg | Freq: Four times a day (QID) | INTRAMUSCULAR | Status: DC | PRN

## 2016-08-08 MED ORDER — METHOCARBAMOL 500 MG PO TABS
ORAL_TABLET | ORAL | Status: AC
  Filled 2016-08-08: qty 1

## 2016-08-08 MED ORDER — LOSARTAN POTASSIUM 50 MG PO TABS
50.0000 mg | ORAL_TABLET | Freq: Every day | ORAL | Status: DC
  Administered 2016-08-08 – 2016-08-10 (×3): 50 mg via ORAL
  Filled 2016-08-08 (×3): qty 1

## 2016-08-08 MED ORDER — OXYCODONE HCL 5 MG PO TABS
5.0000 mg | ORAL_TABLET | ORAL | Status: DC | PRN
  Administered 2016-08-08 – 2016-08-09 (×2): 10 mg via ORAL
  Filled 2016-08-08 (×3): qty 2

## 2016-08-08 MED ORDER — CHLORHEXIDINE GLUCONATE 4 % EX LIQD: 60.0000 mL | Freq: Once | CUTANEOUS | Status: DC

## 2016-08-08 MED ORDER — METHOCARBAMOL 1000 MG/10ML IJ SOLN
500.0000 mg | Freq: Four times a day (QID) | INTRAVENOUS | Status: DC | PRN
  Filled 2016-08-08: qty 5

## 2016-08-08 MED ORDER — APIXABAN 2.5 MG PO TABS
2.5000 mg | ORAL_TABLET | Freq: Two times a day (BID) | ORAL | Status: DC
  Administered 2016-08-09 – 2016-08-10 (×3): 2.5 mg via ORAL
  Filled 2016-08-08 (×3): qty 1

## 2016-08-08 MED ORDER — MIDAZOLAM HCL 5 MG/5ML IJ SOLN
INTRAMUSCULAR | Status: DC | PRN
  Administered 2016-08-08: 1 mg via INTRAVENOUS

## 2016-08-08 MED ORDER — ACETAMINOPHEN 650 MG RE SUPP: 650.0000 mg | Freq: Four times a day (QID) | RECTAL | Status: DC | PRN

## 2016-08-08 MED ORDER — ZOLPIDEM TARTRATE 5 MG PO TABS: 5.0000 mg | ORAL_TABLET | Freq: Every evening | ORAL | Status: DC | PRN

## 2016-08-08 MED ORDER — ONDANSETRON HCL 4 MG/2ML IJ SOLN: 4.0000 mg | Freq: Once | INTRAMUSCULAR | Status: DC | PRN

## 2016-08-08 MED ORDER — PROPOFOL 500 MG/50ML IV EMUL
INTRAVENOUS | Status: DC | PRN
  Administered 2016-08-08: 75 ug/kg/min via INTRAVENOUS

## 2016-08-08 MED ORDER — PHENOL 1.4 % MT LIQD: 1.0000 | OROMUCOSAL | Status: DC | PRN

## 2016-08-08 MED ORDER — SODIUM CHLORIDE 0.9 % IR SOLN
Status: DC | PRN
  Administered 2016-08-08: 1000 mL

## 2016-08-08 MED ORDER — AMLODIPINE BESYLATE 5 MG PO TABS
5.0000 mg | ORAL_TABLET | Freq: Two times a day (BID) | ORAL | Status: DC
  Administered 2016-08-08 – 2016-08-10 (×4): 5 mg via ORAL
  Filled 2016-08-08 (×4): qty 1

## 2016-08-08 MED ORDER — POLYETHYLENE GLYCOL 3350 17 G PO PACK
17.0000 g | PACK | Freq: Every day | ORAL | Status: DC
  Administered 2016-08-09 – 2016-08-10 (×2): 17 g via ORAL
  Filled 2016-08-08 (×2): qty 1

## 2016-08-08 MED ORDER — BUPIVACAINE-EPINEPHRINE (PF) 0.5% -1:200000 IJ SOLN
INTRAMUSCULAR | Status: AC
  Filled 2016-08-08: qty 30

## 2016-08-08 SURGICAL SUPPLY — 67 items
BLADE SAW SAG 73X25 THK (BLADE)
BLADE SAW SGTL 73X25 THK (BLADE) IMPLANT
BRUSH FEMORAL CANAL (MISCELLANEOUS) IMPLANT
COVER SURGICAL LIGHT HANDLE (MISCELLANEOUS) ×3 IMPLANT
DRAPE C-ARM 42X72 X-RAY (DRAPES) IMPLANT
DRAPE ORTHO SPLIT 77X108 STRL (DRAPES) ×3
DRAPE PROXIMA HALF (DRAPES) ×3 IMPLANT
DRAPE SURG ORHT 6 SPLT 77X108 (DRAPES) ×1 IMPLANT
DRAPE U-SHAPE 47X51 STRL (DRAPES) ×3 IMPLANT
DRILL BIT 7/64X5 (BIT) ×3 IMPLANT
DRSG AQUACEL AG ADV 3.5X10 (GAUZE/BANDAGES/DRESSINGS) ×3 IMPLANT
DURAPREP 26ML APPLICATOR (WOUND CARE) ×3 IMPLANT
ELECT BLADE 4.0 EZ CLEAN MEGAD (MISCELLANEOUS)
ELECT BLADE 6.5 EXT (BLADE) IMPLANT
ELECT REM PT RETURN 9FT ADLT (ELECTROSURGICAL) ×3
ELECTRODE BLDE 4.0 EZ CLN MEGD (MISCELLANEOUS) IMPLANT
ELECTRODE REM PT RTRN 9FT ADLT (ELECTROSURGICAL) ×1 IMPLANT
EVACUATOR 1/8 PVC DRAIN (DRAIN) IMPLANT
GAUZE SPONGE 4X4 12PLY STRL (GAUZE/BANDAGES/DRESSINGS) ×1 IMPLANT
GAUZE XEROFORM 5X9 LF (GAUZE/BANDAGES/DRESSINGS) ×3 IMPLANT
GLOVE BIO SURGEON STRL SZ7.5 (GLOVE) ×3 IMPLANT
GLOVE BIO SURGEON STRL SZ8.5 (GLOVE) ×6 IMPLANT
GLOVE BIOGEL PI IND STRL 8 (GLOVE) ×2 IMPLANT
GLOVE BIOGEL PI IND STRL 9 (GLOVE) ×1 IMPLANT
GLOVE BIOGEL PI INDICATOR 8 (GLOVE) ×4
GLOVE BIOGEL PI INDICATOR 9 (GLOVE) ×2
GOWN STRL REUS W/ TWL LRG LVL3 (GOWN DISPOSABLE) ×1 IMPLANT
GOWN STRL REUS W/ TWL XL LVL3 (GOWN DISPOSABLE) ×2 IMPLANT
GOWN STRL REUS W/TWL LRG LVL3 (GOWN DISPOSABLE) ×3
GOWN STRL REUS W/TWL XL LVL3 (GOWN DISPOSABLE) ×6
HANDPIECE INTERPULSE COAX TIP (DISPOSABLE)
HEAD FEM STD 32X+1 STRL (Hips) ×2 IMPLANT
HOOD PEEL AWAY FACE SHEILD DIS (HOOD) ×6 IMPLANT
KIT BASIN OR (CUSTOM PROCEDURE TRAY) ×3 IMPLANT
KIT ROOM TURNOVER OR (KITS) ×3 IMPLANT
LINER MARATHEN 10D 1DX52 OD (Liner) ×3 IMPLANT
LINER MARATHON 10D 1DX52 OD (Liner) IMPLANT
MANIFOLD NEPTUNE II (INSTRUMENTS) ×3 IMPLANT
NEEDLE 22X1 1/2 (OR ONLY) (NEEDLE) ×3 IMPLANT
NS IRRIG 1000ML POUR BTL (IV SOLUTION) ×4 IMPLANT
PACK TOTAL JOINT (CUSTOM PROCEDURE TRAY) ×3 IMPLANT
PAD ARMBOARD 7.5X6 YLW CONV (MISCELLANEOUS) ×6 IMPLANT
PASSER SUT SWANSON 36MM LOOP (INSTRUMENTS) IMPLANT
PRESSURIZER FEMORAL UNIV (MISCELLANEOUS) IMPLANT
RING LOCK ACET OD 52 (Hips) ×2 IMPLANT
SET HNDPC FAN SPRY TIP SCT (DISPOSABLE) IMPLANT
SLEEVE SURGEON STRL (DRAPES) IMPLANT
SPONGE LAP 18X18 X RAY DECT (DISPOSABLE) IMPLANT
STAPLER VISISTAT 35W (STAPLE) ×1 IMPLANT
SUT ETHIBOND 2 V 37 (SUTURE) ×3 IMPLANT
SUT VIC AB 0 CTX 36 (SUTURE) ×3
SUT VIC AB 0 CTX36XBRD ANTBCTR (SUTURE) ×1 IMPLANT
SUT VIC AB 1 CTX 36 (SUTURE) ×3
SUT VIC AB 1 CTX36XBRD ANBCTR (SUTURE) ×1 IMPLANT
SUT VIC AB 2-0 CT1 27 (SUTURE) ×3
SUT VIC AB 2-0 CT1 TAPERPNT 27 (SUTURE) ×1 IMPLANT
SUT VIC AB 3-0 CT1 27 (SUTURE) ×3
SUT VIC AB 3-0 CT1 TAPERPNT 27 (SUTURE) ×1 IMPLANT
SWAB COLLECTION DEVICE MRSA (MISCELLANEOUS) ×3 IMPLANT
SYR 20ML ECCENTRIC (SYRINGE) ×3 IMPLANT
SYR CONTROL 10ML LL (SYRINGE) ×3 IMPLANT
TOWEL OR 17X24 6PK STRL BLUE (TOWEL DISPOSABLE) ×1 IMPLANT
TOWEL OR 17X26 10 PK STRL BLUE (TOWEL DISPOSABLE) ×1 IMPLANT
TOWER CARTRIDGE SMART MIX (DISPOSABLE) IMPLANT
TRAY FOLEY CATH SILVER 14FR (SET/KITS/TRAYS/PACK) ×2 IMPLANT
TUBE ANAEROBIC SPECIMEN COL (MISCELLANEOUS) ×1 IMPLANT
WATER STERILE IRR 1000ML POUR (IV SOLUTION) ×5 IMPLANT

## 2016-08-08 NOTE — Transfer of Care (Signed)
Immediate Anesthesia Transfer of Care Note  Patient: Gerald Wiggins  Procedure(s) Performed: Procedure(s): TOTAL HIP REVISION (Right)  Patient Location: PACU  Anesthesia Type:Spinal  Level of Consciousness: awake, oriented and patient cooperative  Airway & Oxygen Therapy: Patient Spontanous Breathing and Patient connected to nasal cannula oxygen  Post-op Assessment: Report given to RN and Post -op Vital signs reviewed and stable  Post vital signs: Reviewed and stable  Last Vitals:  Vitals:   08/08/16 1022 08/08/16 1359  BP: (!) 166/83 (!) 98/46  Pulse: 64 70  Resp: 20 19  Temp: 36.5 C 36.1 C    Last Pain: There were no vitals filed for this visit.       Complications: No apparent anesthesia complications

## 2016-08-08 NOTE — Anesthesia Postprocedure Evaluation (Addendum)
Anesthesia Post Note  Patient: Gerald Wiggins  Procedure(s) Performed: Procedure(s) (LRB): TOTAL HIP REVISION (Right)  Patient location during evaluation: PACU Anesthesia Type: MAC Level of consciousness: awake, awake and alert and oriented Pain management: pain level controlled Vital Signs Assessment: post-procedure vital signs reviewed and stable Respiratory status: spontaneous breathing, nonlabored ventilation and respiratory function stable Cardiovascular status: blood pressure returned to baseline Anesthetic complications: no       Last Vitals:  Vitals:   08/08/16 1515 08/08/16 1530  BP: (!) 143/79 (!) 135/101  Pulse: (!) 58 61  Resp: 19 14  Temp:      Last Pain: There were no vitals filed for this visit.  LLE Motor Response: No movement due to regional block (08/08/16 1530) LLE Sensation: Decreased (08/08/16 1530) RLE Motor Response: No movement due to regional block (08/08/16 1530) RLE Sensation: Decreased (08/08/16 1530) L Sensory Level: L4-Anterior knee, lower leg (08/08/16 1530) R Sensory Level: L4-Anterior knee, lower leg (08/08/16 1530)  [name]

## 2016-08-08 NOTE — Anesthesia Preprocedure Evaluation (Signed)
Anesthesia Evaluation  Patient identified by MRN, date of birth, ID band Patient awake    Reviewed: Allergy & Precautions, NPO status , Patient's Chart, lab work & pertinent test results  Airway Mallampati: II  TM Distance: >3 FB Neck ROM: Full    Dental  (+) Teeth Intact, Partial Lower, Dental Advisory Given   Pulmonary    breath sounds clear to auscultation       Cardiovascular hypertension,  Rhythm:Irregular Rate:Normal     Neuro/Psych    GI/Hepatic   Endo/Other    Renal/GU      Musculoskeletal   Abdominal   Peds  Hematology   Anesthesia Other Findings   Reproductive/Obstetrics                             Anesthesia Physical Anesthesia Plan  ASA: III  Anesthesia Plan: MAC and Spinal   Post-op Pain Management:    Induction: Intravenous  Airway Management Planned: Simple Face Mask and Natural Airway  Additional Equipment:   Intra-op Plan:   Post-operative Plan:   Informed Consent: I have reviewed the patients History and Physical, chart, labs and discussed the procedure including the risks, benefits and alternatives for the proposed anesthesia with the patient or authorized representative who has indicated his/her understanding and acceptance.   Dental advisory given  Plan Discussed with: CRNA and Anesthesiologist  Anesthesia Plan Comments:         Anesthesia Quick Evaluation

## 2016-08-08 NOTE — Discharge Instructions (Addendum)
INSTRUCTIONS AFTER JOINT REPLACEMENT  ° °o Remove items at home which could result in a fall. This includes throw rugs or furniture in walking pathways °o ICE to the affected joint every three hours while awake for 30 minutes at a time, for at least the first 3-5 days, and then as needed for pain and swelling.  Continue to use ice for pain and swelling. You may notice swelling that will progress down to the foot and ankle.  This is normal after surgery.  Elevate your leg when you are not up walking on it.   °o Continue to use the breathing machine you got in the hospital (incentive spirometer) which will help keep your temperature down.  It is common for your temperature to cycle up and down following surgery, especially at night when you are not up moving around and exerting yourself.  The breathing machine keeps your lungs expanded and your temperature down. ° ° °DIET:  As you were doing prior to hospitalization, we recommend a well-balanced diet. ° °DRESSING / WOUND CARE / SHOWERING ° °Keep the surgical dressing until follow up.  The dressing is water proof, so you can shower without any extra covering.  IF THE DRESSING FALLS OFF or the wound gets wet inside, change the dressing with sterile gauze.  Please use good hand washing techniques before changing the dressing.  Do not use any lotions or creams on the incision until instructed by your surgeon.   ° °ACTIVITY ° °o Increase activity slowly as tolerated, but follow the weight bearing instructions below.   °o No driving for 6 weeks or until further direction given by your physician.  You cannot drive while taking narcotics.  °o No lifting or carrying greater than 10 lbs. until further directed by your surgeon. °o Avoid periods of inactivity such as sitting longer than an hour when not asleep. This helps prevent blood clots.  °o You may return to work once you are authorized by your doctor.  ° ° ° °WEIGHT BEARING  ° °Weight bearing as tolerated with assist  device (walker, cane, etc) as directed, use it as long as suggested by your surgeon or therapist, typically at least 4-6 weeks. ° ° °EXERCISES ° °Results after joint replacement surgery are often greatly improved when you follow the exercise, range of motion and muscle strengthening exercises prescribed by your doctor. Safety measures are also important to protect the joint from further injury. Any time any of these exercises cause you to have increased pain or swelling, decrease what you are doing until you are comfortable again and then slowly increase them. If you have problems or questions, call your caregiver or physical therapist for advice.  ° °Rehabilitation is important following a joint replacement. After just a few days of immobilization, the muscles of the leg can become weakened and shrink (atrophy).  These exercises are designed to build up the tone and strength of the thigh and leg muscles and to improve motion. Often times heat used for twenty to thirty minutes before working out will loosen up your tissues and help with improving the range of motion but do not use heat for the first two weeks following surgery (sometimes heat can increase post-operative swelling).  ° °These exercises can be done on a training (exercise) mat, on the floor, on a table or on a bed. Use whatever works the best and is most comfortable for you.    Use music or television while you are exercising so that   the exercises are a pleasant break in your day. This will make your life better with the exercises acting as a break in your routine that you can look forward to.   Perform all exercises about fifteen times, three times per day or as directed.  You should exercise both the operative leg and the other leg as well. ° °Exercises include: °  °• Quad Sets - Tighten up the muscle on the front of the thigh (Quad) and hold for 5-10 seconds.   °• Straight Leg Raises - With your knee straight (if you were given a brace, keep it on),  lift the leg to 60 degrees, hold for 3 seconds, and slowly lower the leg.  Perform this exercise against resistance later as your leg gets stronger.  °• Leg Slides: Lying on your back, slowly slide your foot toward your buttocks, bending your knee up off the floor (only go as far as is comfortable). Then slowly slide your foot back down until your leg is flat on the floor again.  °• Angel Wings: Lying on your back spread your legs to the side as far apart as you can without causing discomfort.  °• Hamstring Strength:  Lying on your back, push your heel against the floor with your leg straight by tightening up the muscles of your buttocks.  Repeat, but this time bend your knee to a comfortable angle, and push your heel against the floor.  You may put a pillow under the heel to make it more comfortable if necessary.  ° °A rehabilitation program following joint replacement surgery can speed recovery and prevent re-injury in the future due to weakened muscles. Contact your doctor or a physical therapist for more information on knee rehabilitation.  ° ° °CONSTIPATION ° °Constipation is defined medically as fewer than three stools per week and severe constipation as less than one stool per week.  Even if you have a regular bowel pattern at home, your normal regimen is likely to be disrupted due to multiple reasons following surgery.  Combination of anesthesia, postoperative narcotics, change in appetite and fluid intake all can affect your bowels.  ° °YOU MUST use at least one of the following options; they are listed in order of increasing strength to get the job done.  They are all available over the counter, and you may need to use some, POSSIBLY even all of these options:   ° °Drink plenty of fluids (prune juice may be helpful) and high fiber foods °Colace 100 mg by mouth twice a day  °Senokot for constipation as directed and as needed Dulcolax (bisacodyl), take with full glass of water  °Miralax (polyethylene glycol)  once or twice a day as needed. ° °If you have tried all these things and are unable to have a bowel movement in the first 3-4 days after surgery call either your surgeon or your primary doctor.   ° °If you experience loose stools or diarrhea, hold the medications until you stool forms back up.  If your symptoms do not get better within 1 week or if they get worse, check with your doctor.  If you experience "the worst abdominal pain ever" or develop nausea or vomiting, please contact the office immediately for further recommendations for treatment. ° ° °ITCHING:  If you experience itching with your medications, try taking only a single pain pill, or even half a pain pill at a time.  You can also use Benadryl over the counter for itching or also to   help with sleep.   TED HOSE STOCKINGS:  Use stockings on both legs until for at least 2 weeks or as directed by physician office. They may be removed at night for sleeping.  MEDICATIONS:  See your medication summary on the After Visit Summary that nursing will review with you.  You may have some home medications which will be placed on hold until you complete the course of blood thinner medication.  It is important for you to complete the blood thinner medication as prescribed.  PRECAUTIONS:  If you experience chest pain or shortness of breath - call 911 immediately for transfer to the hospital emergency department.   If you develop a fever greater that 101 F, purulent drainage from wound, increased redness or drainage from wound, foul odor from the wound/dressing, or calf pain - CONTACT YOUR SURGEON.                                                   FOLLOW-UP APPOINTMENTS:  If you do not already have a post-op appointment, please call the office for an appointment to be seen by your surgeon.  Guidelines for how soon to be seen are listed in your After Visit Summary, but are typically between 1-4 weeks after surgery.  OTHER INSTRUCTIONS:   Knee  Replacement:  Do not place pillow under knee, focus on keeping the knee straight while resting. CPM instructions: 0-90 degrees, 2 hours in the morning, 2 hours in the afternoon, and 2 hours in the evening. Place foam block, curve side up under heel at all times except when in CPM or when walking.  DO NOT modify, tear, cut, or change the foam block in any way.  MAKE SURE YOU:   Understand these instructions.   Get help right away if you are not doing well or get worse.    Thank you for letting us be a part of your medical care team.  It is a privilege we respect greatly.  We hope these instructions will help you stay on track for a fast and full recovery!      Information on my medicine - ELIQUIS (apixaban)  This medication education was reviewed with me or my healthcare representative as part of my discharge preparation.  The pharmacist that spoke with me during my hospital stay was:  Lennon Alstrom, [facility]  Why was Eliquis prescribed for you? Eliquis was prescribed for you to reduce the risk of a blood clot forming that can cause a stroke if you have a medical condition called atrial fibrillation (a type of irregular heartbeat) and after orthopedic surgery.  What do You need to know about Eliquis ? Take your Eliquis TWICE DAILY - one tablet in the morning and one tablet in the evening with or without food. If you have difficulty swallowing the tablet whole please discuss with your pharmacist how to take the medication safely.  Take Eliquis exactly as prescribed by your doctor and DO NOT stop taking Eliquis without talking to the doctor who prescribed the medication.  Stopping may increase your risk of developing a stroke.  Refill your prescription before you run out.  After discharge, you should have regular check-up appointments with your healthcare provider that is prescribing your Eliquis.  In the future your dose may need to be changed if your kidney function or weight  changes by  a significant amount or as you get older.  What do you do if you miss a dose? If you miss a dose, take it as soon as you remember on the same day and resume taking twice daily.  Do not take more than one dose of ELIQUIS at the same time to make up a missed dose.  Important Safety Information A possible side effect of Eliquis is bleeding. You should call your healthcare provider right away if you experience any of the following: ? Bleeding from an injury or your nose that does not stop. ? Unusual colored urine (red or dark brown) or unusual colored stools (red or black). ? Unusual bruising for unknown reasons. ? A serious fall or if you hit your head (even if there is no bleeding).  Some medicines may interact with Eliquis and might increase your risk of bleeding or clotting while on Eliquis. To help avoid this, consult your healthcare provider or pharmacist prior to using any new prescription or non-prescription medications, including herbals, vitamins, non-steroidal anti-inflammatory drugs (NSAIDs) and supplements.  This website has more information on Eliquis (apixaban): http://www.eliquis.com/eliquis/home

## 2016-08-08 NOTE — Progress Notes (Signed)
Pt.'s wife, reporting that a conversation was had witrh PCP- A. Ross, regarding hematuria.  The plan is for the pcp to follow up post op.

## 2016-08-08 NOTE — Op Note (Signed)
Preop diagnosis: Painful  Postoperative diagnosis: Same  Procedure: Revision right total hip arthroplasty with removal of worn through Depuy duracon 52mm liner and locking ring as well as a +0 28 mm metal head. New 52 mm Duracon liner 10 index posterior superior to except a +0 32 mm metal head on the femoral side.  Surgeon: Feliberto GottronFrank J. Turner Danielsowan M.D.  Assistant: Tomi LikensEric K. Gaylene BrooksPhillips PA-C  (present throughout entire procedure and necessary for timely completion of the procedure)  Estimated blood loss: 300 cc  Fluid replacement: 1500 cc of crystalloid  Complications: None  Indications: Patient with a written total hip arthroplasty placed by one of my partners using Depew metal shell, polyethylene liner and locking ring, cemented stem with a +0 28 mm metal head. Over the years plain radiographs of shown continue to wear of the polyethylene liner and it is now down to its last millimeter or 2. He has had pain and subluxation episodes over the last year that is gotten increasingly worse. He desires elective removal of the worn polyethylene liner upsizing to a 32 mm head and revision of other components as necessary.. Risks and benefits of revision surgery have been discussed and questions answered.  Procedure: Patient was identified by arm band receive preoperative IV antibiotics in the holding area at, and hospital. He was then taken to the operating room where the appropriate anesthetic monitors were attached and general endotracheal anesthesia induced with the patient in the supine position. He was then rolled into the left lateral decubitus position and fixed there with a mark 2 pelvic clamp. A Foley catheter was inserted and the limb prepped and draped in usual sterile fashion from the ankle to the hemipelvis. Time out procedure performed. Skin along the lateral hip and thigh infiltrated with 10 cc of 1/2% Marcaine and epinephrine solution. We began the operation by recreating the old posterior lateral  incision 15 cm in line through the skin and subcutaneous tissue down to the level of the IT band which was cut in line with the skin incision. This exposed the posterior pseudocapsule which was incised off of the intertrochanteric crest exposing the trunnion L femoral head and worn polyethylene liner as well as some Oubre and synovial he reaction which was excised circumferentially around the acetabular component and trunnion. This allowed us to dislocate the hip the metal head was removed with a metal cylinder a mallet and the stem was found to be stable to manual testing. The trunnion of then tucked superior and anterior to the we liner removal tool the liner and locking ring removed without difficulty. We further inspected the acetabular component and found the locking mechanism be in good repair. At this time we selected a new 52 mm 10 Duracon polyethylene liner with a 32 mm inner diameter and locking ring. The locking ring was placed and a new +1.5 mm x 32 mm metal head was hammered onto the stem. Stability was checked the hip could not be dislocated in extension or external rotation and internal rotation was stable to 80 of internal rotation and 90 of flexion. Is thoroughly irrigated out normal saline solution. The soft tissues were then infiltrated with a solution of 20 mL X roll and 30 mL quarter percent Marcaine and epinephrine solution. The pseudocapsule was repaired back to the intertrochanteric crest through drill holes with #1 Ethibond suture the IT band was closed with running #1 Vicryl suture subcutaneous tissue with 201 3-0 undyed Vicryl suture and a subcuticular skin closure was accomplished  followed by an Aquasol dressing. The patient was unclamped rolled supine awakened, and taken to the recovery without difficulty.   A dressing of Mepilex was then applied, the patient was unclamped a rolled supine awakened extubated and taken to the recovery without difficulty.

## 2016-08-08 NOTE — Interval H&P Note (Signed)
History and Physical Interval Note:  08/08/2016 11:43 AM  Gerald Wiggins  has presented today for surgery, with the diagnosis of RIGHT TOTAL HIP ARTHROPLASTY POLY WEAR  The various methods of treatment have been discussed with the patient and family. After consideration of risks, benefits and other options for treatment, the patient has consented to  Procedure(s): TOTAL HIP REVISION (Right) as a surgical intervention .  The patient's history has been reviewed, patient examined, no change in status, stable for surgery.  I have reviewed the patient's chart and labs.  Questions were answered to the patient's satisfaction.     Nestor LewandowskyOWAN,Ellowyn Rieves J

## 2016-08-09 ENCOUNTER — Encounter (HOSPITAL_COMMUNITY): Payer: Self-pay | Admitting: Orthopedic Surgery

## 2016-08-09 LAB — CBC
HCT: 39.5 % (ref 39.0–52.0)
Hemoglobin: 13.2 g/dL (ref 13.0–17.0)
MCH: 28.6 pg (ref 26.0–34.0)
MCHC: 33.4 g/dL (ref 30.0–36.0)
MCV: 85.5 fL (ref 78.0–100.0)
Platelets: 147 K/uL — ABNORMAL LOW (ref 150–400)
RBC: 4.62 MIL/uL (ref 4.22–5.81)
RDW: 14.9 % (ref 11.5–15.5)
WBC: 9.6 K/uL (ref 4.0–10.5)

## 2016-08-09 NOTE — Evaluation (Signed)
Physical Therapy Evaluation Patient Details Name: Gerald Wiggins MRN: [id number] DOB: 1925/04/08 Today's Date: 08/09/2016   History of Present Illness  Gerald Wiggins, 83 y.o. male, has a history of pain and functional disability in the right hip due to arthritis and patient has failed non-surgical conservative treatments for greater than 12 weeks to include NSAID's and/or analgesics, flexibility and strengthening excercises, use of assistive devices and activity modification.  Pt underwent TH revision on 08/08/16.  PMH afib.   Clinical Impression  Pt admitted with above diagnosis. Pt currently with functional limitations due to the deficits listed below (see PT Problem List). Pt was able to ambulate in hallway with cues and min assist due to occasional posterior LOB.  Will follow acutely.  Should progress to HHPT.   Pt will benefit from skilled PT to increase their independence and safety with mobility to allow discharge to the venue listed below.      Follow Up Recommendations Home health PT;Supervision/Assistance - 24 hour;Supervision - Intermittent    Equipment Recommendations  Rolling walker with 5" wheels    Recommendations for Other Services       Precautions / Restrictions Precautions Precautions: Posterior Hip Precaution Booklet Issued: Yes (comment) Restrictions Weight Bearing Restrictions: Yes RLE Weight Bearing: Weight bearing as tolerated      Mobility  Bed Mobility Overal bed mobility: Needs Assistance Bed Mobility: Supine to Sit     Supine to sit: Min assist     General bed mobility comments: Needed assist for elevation of trunk.  Also cues to follow posterior hip precautions.    Transfers Overall transfer level: Needs assistance Equipment used: Rolling walker (2 wheeled) Transfers: Sit to/from Stand Sit to Stand: Min assist         General transfer comment: Pt needed cues for hand placement.  Pt also with posterior LOB upon standing and needed min  assist to recover balance.  Cued pt each stand attempt for posterior precautions.    Ambulation/Gait Ambulation/Gait assistance: Min assist Ambulation Distance (Feet): 165 Feet (125, 20, 20 feet) Assistive device: Rolling walker (2 wheeled) Gait Pattern/deviations: Step-to pattern;Decreased step length - right;Decreased stance time - right;Decreased weight shift to right;Decreased stride length;Antalgic;Trunk flexed;Leaning posteriorly;Staggering left;Staggering right   Gait velocity interpretation: Below normal speed for age/gender General Gait Details: Pt needed incr cues to sequence steps and RW stepping too far into RW with right LE.  Pt also needed cues for upright stance.  Pt had a few LOB posteriorly needing min guard to min assist as occasionally pt would self correct.  Pt ambulated in hallway and then once back in chair had to use bathroom and went to bathroom and back.  Pt was able to wipe himself before he got off toilet.    Stairs            Wheelchair Mobility    Modified Rankin (Stroke Patients Only)       Balance Overall balance assessment: Needs assistance Sitting-balance support: No upper extremity supported;Feet supported Sitting balance-Leahy Scale: Fair     Standing balance support: Bilateral upper extremity supported;During functional activity Standing balance-Leahy Scale: Poor Standing balance comment: Relies on UE support of RW with posterior LOB with static stance.  Pt washed hands at sink after using bathroom and needing min assist as he had posterior LOB and did not self correct.              High level balance activites: Turns;Sudden stops High Level Balance Comments: Min assist  for turns for stability with RW.               Pertinent Vitals/Pain Pain Assessment: 0-10 Pain Score: 4  Pain Location: right hip Pain Descriptors / Indicators: Sore;Tightness Pain Intervention(s): Limited activity within patient's tolerance;Monitored during  session;Repositioned  Ow sats on RA 94-96% throughout.  Home Living Family/patient expects to be discharged to:: Private residence Living Arrangements: Spouse/significant other Available Help at Discharge: Family;Available PRN/intermittently (wife uses RW, son can help as well) Type of Home: House Home Access: Stairs to enter Entrance Stairs-Rails: Right Entrance Stairs-Number of Steps: 1 Home Layout: One level Home Equipment: Shower seat;Grab bars - tub/shower;Hand held shower head;Adaptive equipment Additional Comments: Gym 3 days week for an hour    Prior Function Level of Independence: Independent               Hand Dominance        Extremity/Trunk Assessment   Upper Extremity Assessment Upper Extremity Assessment: Defer to OT evaluation    Lower Extremity Assessment Lower Extremity Assessment: RLE deficits/detail RLE Deficits / Details: limited to 90 degree movement due to hip precautions.  knee and ankle 4/5    Cervical / Trunk Assessment Cervical / Trunk Assessment: Normal  Communication   Communication: No difficulties  Cognition Arousal/Alertness: Awake/alert Behavior During Therapy: WFL for tasks assessed/performed Overall Cognitive Status: Within Functional Limits for tasks assessed                      General Comments General comments (skin integrity, edema, etc.): Wife camein and we discussed equipment, precautions and how pt session went.     Exercises Total Joint Exercises Ankle Circles/Pumps: AROM;Both;10 reps;Supine Quad Sets: AROM;Both;10 reps;Supine Gluteal Sets: AROM;Both;10 reps;Supine Hip ABduction/ADduction: AROM;Both;10 reps;Standing Long Arc Quad: AROM;Both;10 reps;Supine Marching in Standing: AROM;Both;10 reps;Standing Standing Hip Extension: AROM;Both;10 reps;Standing   Assessment/Plan    PT Assessment Patient needs continued PT services  PT Problem List Decreased strength;Decreased range of motion;Decreased activity  tolerance;Decreased balance;Decreased mobility;Decreased knowledge of use of DME;Decreased safety awareness;Pain;Decreased knowledge of precautions       PT Treatment Interventions DME instruction;Gait training;Functional mobility training;Stair training;Therapeutic activities;Therapeutic exercise;Balance training;Patient/family education    PT Goals (Current goals can be found in the Care Plan section)  Acute Rehab PT Goals Patient Stated Goal: to go home PT Goal Formulation: With patient Time For Goal Achievement: 08/16/16 Potential to Achieve Goals: Good    Frequency 7X/week   Barriers to discharge Decreased caregiver support (wife on RW so Supervision only)      Co-evaluation               End of Session Equipment Utilized During Treatment: Gait belt Activity Tolerance: Patient limited by fatigue;Patient limited by pain Patient left: in chair;with call bell/phone within reach;with family/visitor present Nurse Communication: Mobility status PT Visit Diagnosis: Unsteadiness on feet (R26.81);Pain Pain - Right/Left: Right Pain - part of body: Hip         Time: 0900-0956 PT Time Calculation (min) (ACUTE ONLY): 56 min   Charges:   PT Evaluation $PT Eval Moderate Complexity: 1 Procedure PT Treatments $Gait Training: 8-22 mins $Therapeutic Exercise: 8-22 mins $Self Care/Home Management: 8-22   PT G Codes:         Berline Lopes 09/06/16, 10:51 AM [facility] [phone] (pager)

## 2016-08-09 NOTE — Evaluation (Signed)
Occupational Therapy Evaluation Patient Details Name: Gerald Wiggins MRN: [id number] DOB: [birth year] Today's Date: 08/09/2016    History of Present Illness Gerald Wiggins, 83 y.o. male, has a history of pain and functional disability in the right hip due to arthritis and patient has failed non-surgical conservative treatments for greater than 12 weeks to include NSAID's and/or analgesics, flexibility and strengthening excercises, use of assistive devices and activity modification.  Pt underwent TH revision on 08/08/16.  PMH afib.    Clinical Impression   Pt currently min assist for LB selfcare and toilet transfers at this time.  He was able to state 1/3 THR precautions initially and able to follow with min instructional cueing for simulated selfcare tasks and functional transfers.  Feel he will benefit from acute care OT to progress to supervision level for home with spouse.  Recommend HHOT for continued rehab at this time to progress to modified independent level.      Follow Up Recommendations  Home health OT;Supervision/Assistance - 24 hour    Equipment Recommendations  3 in 1 bedside commode       Precautions / Restrictions Precautions Precautions: Posterior Hip Restrictions Weight Bearing Restrictions: No RLE Weight Bearing: Weight bearing as tolerated      Mobility Bed Mobility                  Transfers Overall transfer level: Needs assistance Equipment used: Rolling walker (2 wheeled) Transfers: Sit to/from Stand Sit to Stand: Min assist         General transfer comment: Mod instructional cueing for hand placement for sit to stand during toilet transfers and when standing from the EOB.    Balance Overall balance assessment: Needs assistance Sitting-balance support: Feet supported Sitting balance-Leahy Scale: Good     Standing balance support: During functional activity Standing balance-Leahy Scale: Fair Standing balance comment: Pt needs UE support  with use of the RW for balance.                             ADL Overall ADL's : Needs assistance/impaired Eating/Feeding: Independent   Grooming: Wash/dry hands;Wash/dry face;Standing;Minimal assistance   Upper Body Bathing: Supervision/ safety;Sitting   Lower Body Bathing: Minimal assistance;Sit to/from stand;Adhering to hip precautions   Upper Body Dressing : Supervision/safety;Sitting   Lower Body Dressing: Minimal assistance;With adaptive equipment;Adhering to hip precautions;Sit to/from stand   Toilet Transfer: Minimal assistance;BSC;Ambulation;RW   Toileting- Clothing Manipulation and Hygiene: Minimal assistance;Sit to/from stand   Tub/ Shower Transfer: Walk-in shower;Minimal assistance;Rolling walker;Ambulation   Functional mobility during ADLs: Minimal assistance;Rolling walker General ADL Comments: Pt able to state 1/3 THR precautions during session.  Educated on use of AE as well as availability in the gift shop.  He was able to return demonstrate appropriate use with mod instructional cueing for socka aide and reacher.  Practiced simulated shower transfer and discussed anterior transfer with RW vs posterior.  Pt's spouse will assess further tonight and determine which option may work the best at home.      Vision Baseline Vision/History: Wears glasses Wears Glasses: At all times Patient Visual Report: No change from baseline Vision Assessment?: No apparent visual deficits     Perception Perception Perception Tested?: No   Praxis Praxis Praxis tested?: Not tested    Pertinent Vitals/Pain Pain Assessment: 0-10 Pain Score: 4  Pain Location: right hip Pain Descriptors / Indicators: Sore;Tightness     Hand Dominance Right  Extremity/Trunk Assessment Upper Extremity Assessment Upper Extremity Assessment: Overall WFL for tasks assessed   Lower Extremity Assessment Lower Extremity Assessment: Defer to PT evaluation   Cervical / Trunk  Assessment Cervical / Trunk Assessment: Kyphotic   Communication Communication Communication: No difficulties   Cognition Arousal/Alertness: Awake/alert Behavior During Therapy: WFL for tasks assessed/performed Overall Cognitive Status: Within Functional Limits for tasks assessed                 General Comments: Pt was able to state 1/3 THR precautions at start of session but did not notice any other memory or cognitive impairements that needed addressing.               Home Living Family/patient expects to be discharged to:: Private residence Living Arrangements: Spouse/significant other Available Help at Discharge: Family;Available PRN/intermittently (wife uses RW, son can help as well) Type of Home: House Home Access: Stairs to enter [employer] of Steps: 1 Entrance Stairs-Rails: Right Home Layout: One level     Bathroom Shower/Tub: Producer, television/film/video: Handicapped height Bathroom Accessibility: Yes   Home Equipment: Shower seat;Grab bars - tub/shower;Hand held shower head;Adaptive equipment Adaptive Equipment: Long-handled sponge Additional Comments: Gym 3 days week for an hour      Prior Functioning/Environment Level of Independence: Independent                 OT Problem List: Decreased strength;Decreased activity tolerance;Impaired balance (sitting and/or standing);Pain;Decreased knowledge of use of DME or AE;Decreased knowledge of precautions      OT Treatment/Interventions: Self-care/ADL training;Patient/family education;Balance training;Therapeutic activities;DME and/or AE instruction    OT Goals(Current goals can be found in the care plan section) Acute Rehab OT Goals Patient Stated Goal: To go home tomorrow.  OT Goal Formulation: With patient/family Time For Goal Achievement: 08/16/16 Potential to Achieve Goals: Good  OT Frequency: Min 2X/week   Barriers to D/C: Decreased caregiver support  Pt's wife uses a  Youth worker          End of Session Equipment Utilized During Treatment: Engineer, water Communication: Mobility status  Activity Tolerance: Patient tolerated treatment well Patient left: in chair;with call bell/phone within reach;with family/visitor present  OT Visit Diagnosis: Unsteadiness on feet (R26.81);Other abnormalities of gait and mobility (R26.89);Muscle weakness (generalized) (M62.81)                ADL either performed or assessed with clinical judgement  Time: 1400-1448 OT Time Calculation (min): 48 min Charges:  OT General Charges $OT Visit: 1 Procedure OT Evaluation $OT Eval Moderate Complexity: 1 Procedure OT Treatments $Self Care/Home Management : 23-37 mins    Laqueta Bonaventura OTR/L 08/09/2016, 2:59 PM

## 2016-08-09 NOTE — Progress Notes (Signed)
Physical Therapy Treatment Patient Details Name: Gerald Wiggins MRN: [id number] DOB: 12-Jul-1924 Today's Date: 08/09/2016    History of Present Illness Gerald Wiggins, 83 y.o. male, has a history of pain and functional disability in the right hip due to arthritis and patient has failed non-surgical conservative treatments for greater than 12 weeks to include NSAID's and/or analgesics, flexibility and strengthening excercises, use of assistive devices and activity modification.  Pt underwent TH revision on 08/08/16.  PMH afib.     PT Comments    Pt performed increased gait and performed standing exercises from HEP.  Plan for stair training next session.     Follow Up Recommendations  Home health PT;Supervision/Assistance - 24 hour;Supervision - Intermittent     Equipment Recommendations  Rolling walker with 5" wheels    Recommendations for Other Services       Precautions / Restrictions Precautions Precautions: Posterior Hip Precaution Booklet Issued: Yes (comment) Restrictions Weight Bearing Restrictions: No RLE Weight Bearing: Weight bearing as tolerated    Mobility  Bed Mobility Overal bed mobility: Needs Assistance Bed Mobility: Supine to Sit     Supine to sit: Min assist     General bed mobility comments: Assist for advancement of RLE to maintain hip precautions.    Transfers Overall transfer level: Needs assistance Equipment used: Rolling walker (2 wheeled) Transfers: Sit to/from Stand Sit to Stand: Min guard         General transfer comment: Cues to push from seated surface and cues to advance RLE forward.    Ambulation/Gait Ambulation/Gait assistance: Supervision Ambulation Distance (Feet): 200 Feet Assistive device: Rolling walker (2 wheeled) Gait Pattern/deviations: Step-to pattern;Step-through pattern;Trunk flexed;Decreased stride length   Gait velocity interpretation: at or above normal speed for age/gender General Gait Details: Cues for  progression to step through pt regressed to step to pattern for comfort.  Cues for RW and sequencing to back up.     Stairs            Wheelchair Mobility    Modified Rankin (Stroke Patients Only)       Balance Overall balance assessment: Needs assistance Sitting-balance support: Feet supported Sitting balance-Leahy Scale: Good     Standing balance support: During functional activity Standing balance-Leahy Scale: Fair Standing balance comment: Pt needs UE support with use of the RW for balance.                     Cognition Arousal/Alertness: Awake/alert Behavior During Therapy: WFL for tasks assessed/performed Overall Cognitive Status: Within Functional Limits for tasks assessed                 General Comments: Required VCs to recall 3/3 hip precautions    Exercises Total Joint Exercises Hip ABduction/ADduction: AROM;Right;10 reps;Standing Knee Flexion: AROM;Right;10 reps;Standing Marching in Standing: AROM;Right;10 reps;Standing Standing Hip Extension: AROM;Right;10 reps;Standing    General Comments        Pertinent Vitals/Pain Pain Assessment: 0-10 Pain Score: 3  Pain Location: right hip Pain Descriptors / Indicators: Sore;Tightness Pain Intervention(s): Monitored during session;Repositioned    Home Living Family/patient expects to be discharged to:: Private residence Living Arrangements: Spouse/significant other Available Help at Discharge: Family;Available PRN/intermittently (wife uses RW, son can help as well) Type of Home: House Home Access: Stairs to enter Entrance Stairs-Rails: Right Home Layout: One level Home Equipment: Shower seat;Grab bars - tub/shower;Hand held shower head;Adaptive equipment Additional Comments: Gym 3 days week for an hour    Prior Function Level of  Independence: Independent          PT Goals (current goals can now be found in the care plan section) Acute Rehab PT Goals Patient Stated Goal: To go home  tomorrow.  Potential to Achieve Goals: Good Progress towards PT goals: Progressing toward goals    Frequency    7X/week      PT Plan Current plan remains appropriate    Co-evaluation             End of Session Equipment Utilized During Treatment: Gait belt Activity Tolerance: Patient limited by fatigue;Patient limited by pain Patient left: in chair;with call bell/phone within reach;with family/visitor present Nurse Communication: Mobility status PT Visit Diagnosis: Unsteadiness on feet (R26.81);Pain Pain - Right/Left: Right Pain - part of body: Hip     Time: [id number] PT Time Calculation (min) (ACUTE ONLY): 25 min  Charges:  $Gait Training: 8-22 mins $Therapeutic Exercise: 8-22 mins                    G Codes:       Florestine Aversimee J Nashea Chumney 08/09/2016, 5:32 PM Joycelyn RuaAimee Kathlynn Swofford, PTA pager [phone]

## 2016-08-09 NOTE — Care Management Note (Signed)
Case Management Note  Patient Details  Name: Gerald Wiggins MRN: [id number] Date of Birth: [birth year]  Subjective/Objective:    83 yr old gentleman S/p right total hip arthroplasty revision.              Action/Plan: Patient was [facility], no changes. CM has ordered RW and 3in1. Patient will have family support at discharge.    Expected Discharge Date:    08/10/16             Expected Discharge Plan:  Home w Home Health Services  In-House Referral:  NA  Discharge planning Services  CM Consult  Post Acute Care Choice:  Home Health, Durable Medical Equipment Choice offered to:  Patient  DME Arranged:  3-N-1, Walker rolling DME Agency:  [facility]  HH Arranged:  PT, OT [facility] Agency:  [facility]  Status of Service:  Completed, signed off  If discussed at Long Length of Stay Meetings, dates discussed:    Additional Comments:  Durenda GuthrieBrady, Minas Bonser Naomi, RN 08/09/2016, 3:38 PM

## 2016-08-09 NOTE — Progress Notes (Signed)
PATIENT ID: Gerald Wiggins  MRN: [id number]  DOB/AGE:  [birth year] / 83 y.o.  1 Day Post-Op Procedure(s) (LRB): TOTAL HIP REVISION (Right)    PROGRESS NOTE Subjective: Patient is alert, oriented, no Nausea, no Vomiting, yes passing gas, . Taking PO well. Denies SOB, Chest or Calf Pain. Using Incentive Spirometer, PAS in place. Ambulate WBAT Patient reports pain as  2/10  .    Objective: Vital signs in last 24 hours: Vitals:   08/08/16 1630 08/08/16 2107 08/09/16 0045 08/09/16 0500  BP: (!) 161/82 131/65 120/66 134/60  Pulse: 78 60 (!) 57 65  Resp: 16 18 20 20   Temp: 97.5 F (36.4 C) 98.7 F (37.1 C) 98.7 F (37.1 C) 98.7 F (37.1 C)  TempSrc:  Oral Oral Oral  SpO2: 100% 97% 93% 95%      Intake/Output from previous day: I/O last 3 completed shifts: In: 1120 [P.O.:120; I.V.:1000] Out: 625 [Urine:575; Blood:50]   Intake/Output this shift: No intake/output data recorded.   LABORATORY DATA:  Recent Labs  08/08/16 1022 08/09/16 0616  WBC  --  9.6  HGB  --  13.2  HCT  --  39.5  PLT  --  147*  INR 1.10  --     Examination: Neurologically intact ABD soft Neurovascular intact Sensation intact distally Intact pulses distally Dorsiflexion/Plantar flexion intact Incision: no drainage No cellulitis present Compartment soft} XR AP&Lat of hip shows well placed\fixed THA  Assessment:   1 Day Post-Op Procedure(s) (LRB): TOTAL HIP REVISION (Right) ADDITIONAL DIAGNOSIS:  Expected Acute Blood Loss Anemia, Hypertension, CAD  Plan: PT/OT WBAT, THA, post precautions  DVT Prophylaxis: SCDx72 hrs, Eliquis 5 mg BID x 2 weeks  DISCHARGE PLAN: Home  DISCHARGE NEEDS: HHPT, Walker and 3-in-1 comode seat

## 2016-08-10 ENCOUNTER — Encounter: Payer: Self-pay | Admitting: Internal Medicine

## 2016-08-10 LAB — CBC
HEMATOCRIT: 40.9 % (ref 39.0–52.0)
HEMOGLOBIN: 13.7 g/dL (ref 13.0–17.0)
MCH: 28.7 pg (ref 26.0–34.0)
MCHC: 33.5 g/dL (ref 30.0–36.0)
MCV: 85.6 fL (ref 78.0–100.0)
Platelets: 160 10*3/uL (ref 150–400)
RBC: 4.78 MIL/uL (ref 4.22–5.81)
RDW: 14.7 % (ref 11.5–15.5)
WBC: 16.5 10*3/uL — AB (ref 4.0–10.5)

## 2016-08-10 NOTE — Discharge Summary (Signed)
Patient ID: Gerald Wiggins MRN: [id number] DOB/AGE: 1925/05/19 83 y.o.  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 Abdominal aortic aneurysm (HCC) 11/17/2009   diameter of 3.2  x 3.4 cm.  . Coronary artery disease   . Dysrhythmia    atrial fib  . HTN (hypertension)   . Hyperkalemia   . Hyperlipidemia   . Ischemic heart disease   . Myocardial infarction   . Obesity     Surgeries: Procedure(s): TOTAL HIP REVISION on 08/08/2016   Consultants:   Discharged Condition: Improved  Hospital Course: Gerald Wiggins is an 83 y.o. male who was admitted 08/08/2016 for operative treatment ofWorn out joint prosthesis of hip (HCC). Patient has severe unremitting pain that affects sleep, daily activities, and work/hobbies. After pre-op clearance the patient was taken to the operating room on 08/08/2016 and underwent  Procedure(s): TOTAL HIP REVISION.    Patient was given perioperative antibiotics: Anti-infectives    Start     Dose/Rate Route Frequency Ordered Stop   08/08/16 1200  ceFAZolin (ANCEF) IVPB 2g/100 mL premix     2 g 200 mL/hr over 30 Minutes Intravenous To [facility] 08/05/16 0758 08/08/16 1235       Patient was given sequential compression devices, early ambulation, and chemoprophylaxis to prevent DVT.  Patient benefited maximally from hospital stay and there were no complications.    Recent vital signs: Patient Vitals for the past 24 hrs:  BP Temp Temp src Pulse Resp SpO2  08/10/16 0421 (!) 143/65 98.2 F (36.8 C) Oral (!) 59 18 95 %  08/09/16 2033 (!) 145/66 97 F (36.1 C) Oral 65 18 95 %  08/09/16 1425 (!) 145/76 97 F (36.1 C) Oral 69 18 95 %  08/09/16 1403 - - - 71 - 95 %     Recent laboratory studies:  Recent Labs   08/08/16 1022 08/09/16 0616 08/10/16 0504  WBC  --  9.6 16.5*  HGB  --  13.2 13.7  HCT  --  39.5 40.9  PLT  --  147* 160  INR 1.10  --   --      Discharge Medications:   Allergies as of 08/10/2016      Reactions   No Known Allergies       Medication List    STOP taking these medications   amoxicillin 500 MG tablet Commonly known as:  AMOXIL   ibuprofen 200 MG tablet Commonly known as:  ADVIL,MOTRIN     TAKE these medications   amLODipine 5 MG tablet Commonly known as:  NORVASC Take 5 mg by mouth 2 (two) times daily.   apixaban 2.5 MG Tabs tablet Commonly known as:  ELIQUIS Take 1 tablet (2.5 mg total) by mouth 2 (two) times daily. What changed:  medication strength  how much to take   atorvastatin 40 MG tablet Commonly known as:  LIPITOR Take 40 mg by mouth daily.   CALCIUM-VITAMIN D PO Take 1 tablet by mouth daily.   HYDROcodone-acetaminophen 5-325 MG tablet Commonly known as:  NORCO Take 1-2 tablets by mouth every 4 (four) hours as needed.   losartan 50 MG tablet Commonly known as:  COZAAR Take 1 tablet (50 mg total) by mouth daily.   multivitamin tablet Take 1 tablet  by mouth daily.   OVER THE COUNTER MEDICATION Take 1 capsule by mouth daily. CLEAR LAX  LAXATIVE   tiZANidine 2 MG tablet Commonly known as:  ZANAFLEX Take 1 tablet (2 mg total) by mouth every 6 (six) hours as needed for muscle spasms.   vitamin C 500 MG tablet Commonly known as:  ASCORBIC ACID Take 500 mg by mouth daily.            Durable Medical Equipment        Start     Ordered   08/08/16 1647  DME Walker rolling  Once    Question:  Patient needs a walker to treat with the following condition  Answer:  Status post revision of total hip   08/08/16 1647   08/08/16 1647  DME 3 n 1  Once     08/08/16 1647   08/08/16 1647  DME Bedside commode  Once    Question:  Patient needs a bedside commode to treat with the following condition  Answer:  Status post revision of  total hip   08/08/16 1647      Diagnostic Studies: Dg Chest 2 View  Result Date: 08/02/2016 CLINICAL DATA:  83 year old male preop hip replacement. Initial encounter. EXAM: CHEST  2 VIEW COMPARISON:  None. FINDINGS: No infiltrate, congestive heart failure or pneumothorax. Probable prominent left nipple shadow and anterior aspect left 6 rib causing slightly nodular appearance left lung base. Biliary nodularity peripheral aspect right lung may represent chronic changes possibly from aspiration. Heart size within normal limits. Calcified significantly tortuous aorta. Acromioclavicular joint degenerative changes. Mild scoliosis thoracic spine convex right with superimposed mild degenerative changes. IMPRESSION: No infiltrate or congestive heart failure. Biliary nodularity peripheral aspect right lung may represent chronic changes possibly from aspiration. Probable prominent left nipple shadow and anterior aspect left 6 rib causing slightly nodular appearance left lung base. Calcified significantly tortuous aorta. Electronically Signed   By: Lacy Duverney M.D.   On: 08/02/2016 09:55   Dg Pelvis Portable  Result Date: 08/08/2016 CLINICAL DATA:  Right hip revision EXAM: PORTABLE PELVIS 1-2 VIEWS COMPARISON:  None. FINDINGS: Right hip prosthesis is noted in place. No acute bony abnormality is seen. The soft tissue abnormality is noted. IMPRESSION: Right hip replacement in satisfactory position. Electronically Signed   By: Alcide Clever M.D.   On: 08/08/2016 14:41    Disposition: Final discharge disposition not confirmed  Discharge Instructions    Call MD / Call 911    Complete by:  As directed    If you experience chest pain or shortness of breath, CALL 911 and be transported to the hospital emergency room.  If you develope a fever above 101 F, pus (white drainage) or increased drainage or redness at the wound, or calf pain, call your surgeon's office.   Constipation Prevention    Complete by:  As  directed    Drink plenty of fluids.  Prune juice may be helpful.  You may use a stool softener, such as Colace (over the counter) 100 mg twice a day.  Use MiraLax (over the counter) for constipation as needed.   Diet - low sodium heart healthy    Complete by:  As directed    Driving restrictions    Complete by:  As directed    No driving for 2 weeks   Follow the hip precautions as taught in Physical Therapy    Complete by:  As directed    Increase activity slowly as tolerated  Complete by:  As directed    Patient may shower    Complete by:  As directed    You may shower without a dressing once there is no drainage.  Do not wash over the wound.  If drainage remains, cover wound with plastic wrap and then shower.      Follow-up Information    Nestor LewandowskyOWAN,FRANK J, MD Follow up in 2 week(s).   Specialty:  Orthopedic Surgery Contact information: [address] [place] [address] [phone]        [facility] Follow [facility].   Specialty:  Home Health Services Why:  [facility] will contact you to arrange start date and time for therapy. Contact information: [address] [place] [address] [phone]            Signed: Vear ClockHILLIPS, Jaxxon Naeem R 08/10/2016, 8:01 AM

## 2016-08-10 NOTE — Progress Notes (Signed)
Physical Therapy Treatment Patient Details Name: Gerald EtienneWilliam C Wiggins MRN: [id number] DOB: [birth year] Today's Date: 08/10/2016    History of Present Illness Gerald Wiggins, 83 y.o. male, has a history of pain and functional disability in the right hip due to arthritis and patient has failed non-surgical conservative treatments for greater than 12 weeks to include NSAID's and/or analgesics, flexibility and strengthening excercises, use of assistive devices and activity modification.  Pt underwent TH revision on 08/08/16.  PMH afib.     PT Comments    Pt performed increased mobility including stair training in prep for d/c home.  Pt reviewed HEP for accuracy and educated on frequency of when to perform.  Pt able to recall 3/3 precautions and PTA answered all questions.  Informed nursing that from a mobility stand point patient is ready to d/c home.    Follow Up Recommendations  Home health PT;Supervision/Assistance - 24 hour;Supervision - Intermittent     Equipment Recommendations  Rolling walker with 5" wheels    Recommendations for Other Services       Precautions / Restrictions Precautions Precautions: Posterior Hip Precaution Booklet Issued: Yes (comment) Restrictions Weight Bearing Restrictions: Yes RLE Weight Bearing: Weight bearing as tolerated    Mobility  Bed Mobility               General bed mobility comments: Pt in recliner on arrival.    Transfers Overall transfer level: Needs assistance Equipment used: Rolling walker (2 wheeled) Transfers: Sit to/from Stand Sit to Stand: Supervision         General transfer comment: Cues for hand placement to push from seated surface.    Ambulation/Gait Ambulation/Gait assistance: Supervision Ambulation Distance (Feet): 350 Feet Assistive device: Rolling walker (2 wheeled) Gait Pattern/deviations: Step-through pattern;Trunk flexed;Decreased stride length   Gait velocity interpretation: Below normal speed for  age/gender General Gait Details: Cues for continuation of RW to maintain step to pattern.  Cues for sequencing, increasing stride length and postural awareness.    Stairs Stairs: Yes  Supervision Stair Management: One rail Right;Forwards Number of Stairs: 5 General stair comments: Cues for sequencing and use of hand rail.    Wheelchair Mobility    Modified Rankin (Stroke Patients Only)       Balance Overall balance assessment: Needs assistance   Sitting balance-Leahy Scale: Good       Standing balance-Leahy Scale: Fair                      Cognition Arousal/Alertness: Awake/alert Behavior During Therapy: WFL for tasks assessed/performed Overall Cognitive Status: Within Functional Limits for tasks assessed                      Exercises Total Joint Exercises Ankle Circles/Pumps: AROM;Both;10 reps;Supine Quad Sets: AROM;10 reps;Supine;Right Short Arc Quad: AROM;Right;10 reps;Supine Heel Slides: AROM;Right;10 reps;Supine Hip ABduction/ADduction: AROM;Right;20 reps (1x10 standing and 1x10 in supine.  ) Long Arc Quad: AROM;Right;10 reps;Seated Knee Flexion: AROM;Right;10 reps;Standing Marching in Standing: AROM;Right;10 reps;Standing Standing Hip Extension: AROM;Right;10 reps;Standing    General Comments        Pertinent Vitals/Pain Pain Assessment: 0-10 Pain Score: 3  Pain Location: right hip Pain Descriptors / Indicators: Sore;Tightness Pain Intervention(s): Monitored during session;Repositioned    Home Living                      Prior Function            PT Goals (current goals  can now be found in the care plan section) Acute Rehab PT Goals Patient Stated Goal: To go home tomorrow.  Potential to Achieve Goals: Good Progress towards PT goals: Progressing toward goals    Frequency    7X/week      PT Plan Current plan remains appropriate    Co-evaluation             End of Session Equipment Utilized During  Treatment: Gait belt Activity Tolerance: Patient limited by fatigue;Patient limited by pain Patient left: in chair;with call bell/phone within reach;with family/visitor present Nurse Communication: Mobility status PT Visit Diagnosis: Unsteadiness on feet (R26.81);Pain Pain - Right/Left: Right Pain - part of body: Hip     Time: 1610-9604 PT Time Calculation (min) (ACUTE ONLY): 25 min  Charges:  $Gait Training: 8-22 mins $Therapeutic Exercise: 8-22 mins                    G Codes:       Florestine Avers 08/15/2016, 12:26 PM  Joycelyn Rua, PTA pager [phone]

## 2016-08-10 NOTE — Progress Notes (Signed)
PATIENT ID: Gerald EtienneWilliam C Schrier  MRN: [id number]  DOB/AGE:  81-Aug-1926 / 81 y.o.  2 Days Post-Op Procedure(s) (LRB): TOTAL HIP REVISION (Right)    PROGRESS NOTE Subjective: Patient is alert, oriented, no Nausea, no Vomiting, yes passing gas, . Taking PO well. Denies SOB, Chest or Calf Pain. Using Incentive Spirometer, PAS in place. Ambulate WBAT with pt walking 200 ft with therapy Patient reports pain as  0/10  .    Objective: Vital signs in last 24 hours: Vitals:   08/09/16 1403 08/09/16 1425 08/09/16 2033 08/10/16 0421  BP:  (!) 145/76 (!) 145/66 (!) 143/65  Pulse: 71 69 65 (!) 59  Resp:  18 18 18   Temp:  97 F (36.1 C) 97 F (36.1 C) 98.2 F (36.8 C)  TempSrc:  Oral Oral Oral  SpO2: 95% 95% 95% 95%      Intake/Output from previous day: I/O last 3 completed shifts: In: 3332.5 [P.O.:720; I.V.:2612.5] Out: 800 [Urine:800]   Intake/Output this shift: No intake/output data recorded.   LABORATORY DATA:  Recent Labs  08/08/16 1022 08/09/16 0616 08/10/16 0504  WBC  --  9.6 16.5*  HGB  --  13.2 13.7  HCT  --  39.5 40.9  PLT  --  147* 160  INR 1.10  --   --     Examination: Neurologically intact Neurovascular intact Sensation intact distally Intact pulses distally Dorsiflexion/Plantar flexion intact Incision: dressing C/D/I No cellulitis present Compartment soft} XR AP&Lat of hip shows well placed\fixed THA  Assessment:   2 Days Post-Op Procedure(s) (LRB): TOTAL HIP REVISION (Right) ADDITIONAL DIAGNOSIS:  Expected Acute Blood Loss Anemia, Hypertension  Plan: PT/OT WBAT, THA, posterior hip precautions  DVT Prophylaxis: SCDx72 hrs, Eliquis 2.5 mg BID x 2 weeks  DISCHARGE PLAN: Home  DISCHARGE NEEDS: HHPT, Walker and 3-in-1 comode seat

## 2016-08-10 NOTE — Progress Notes (Signed)
Occupational Therapy Treatment Patient Details Name: MAHMUD KEITHLY MRN: [id number] DOB: 02-09-1925 Today's Date: 08/10/2016    History of present illness [name], 83 y.o. male, has a history of pain and functional disability in the right hip due to arthritis and patient has failed non-surgical conservative treatments for greater than 12 weeks to include NSAID's and/or analgesics, flexibility and strengthening excercises, use of assistive devices and activity modification.  Pt underwent TH revision on 08/08/16.  PMH afib.    OT comments  Pt progressing well with adls now recalling all hip precautions. Pt also doing better using adaptive equipment for LE dressing.  Pt due to d/c today and should be safe with wife supervision.    Follow Up Recommendations  Home health OT;Supervision/Assistance - 24 hour    Equipment Recommendations  3 in 1 bedside commode    Recommendations for Other Services      Precautions / Restrictions Precautions Precautions: Posterior Hip Precaution Booklet Issued: Yes (comment) Restrictions Weight Bearing Restrictions: Yes RLE Weight Bearing: Weight bearing as tolerated       Mobility Bed Mobility               General bed mobility comments: Pt in recliner on arrival.    Transfers Overall transfer level: Needs assistance Equipment used: Rolling walker (2 wheeled) Transfers: Sit to/from Stand Sit to Stand: Supervision         General transfer comment: Cues for hand placement to push from seated surface.      Balance Overall balance assessment: Needs assistance Sitting-balance support: Feet supported Sitting balance-Leahy Scale: Good     Standing balance support: During functional activity Standing balance-Leahy Scale: Fair Standing balance comment: Pt needs UE support with use of the RW for balance.                    ADL Overall ADL's : Needs assistance/impaired Eating/Feeding: Independent   Grooming: Wash/dry  hands;Supervision/safety;Standing   Upper Body Bathing: Supervision/ safety;Sitting   Lower Body Bathing: Minimal assistance;Sit to/from stand;Adhering to hip precautions Lower Body Bathing Details (indicate cue type and reason): Pt has long sponge at home that should help in in achieving mod I status. Upper Body Dressing : Supervision/safety;Sitting   Lower Body Dressing: Minimal assistance;With adaptive equipment;Adhering to hip precautions;Sit to/from stand Lower Body Dressing Details (indicate cue type and reason): reviewed use of reacher and sock aid for LE dressing. Toilet Transfer: Min Paediatric nurse and Hygiene: Supervision/safety;Sit to/from stand   Tub/ Shower Transfer: Walk-in shower;Minimal assistance;Rolling walker;Ambulation Tub/Shower Transfer Details (indicate cue type and reason): instructed on forward transfer but also talked to pt about doing transfer backward due to how his shower is set up. Functional mobility during ADLs: Min guard;Rolling walker General ADL Comments: Pt stated 3/3 precautions during session.      Vision                     Perception     Praxis      Cognition   Behavior During Therapy: [facility] for tasks assessed/performed Overall Cognitive Status: Within Functional Limits for tasks assessed                  General Comments: Required VCs to recall 3/3 hip precautions      Exercises Total Joint Exercises Ankle Circles/Pumps: AROM;Both;10 reps;Supine Quad Sets: AROM;10 reps;Supine;Right Short Arc Quad: AROM;Right;10 reps;Supine Heel Slides: AROM;Right;10 reps;Supine Hip ABduction/ADduction: AROM;Right;20 reps (1x10 standing and  1x10 in supine.  ) Long Arc Quad: AROM;Right;10 reps;Seated Knee Flexion: AROM;Right;10 reps;Standing Marching in Standing: AROM;Right;10 reps;Standing Standing Hip Extension: AROM;Right;10 reps;Standing   Shoulder Instructions       General Comments       Pertinent Vitals/ Pain       Pain Assessment: No/denies pain Pain Score: 3  Pain Location: right hip Pain Descriptors / Indicators: Sore;Tightness Pain Intervention(s): Monitored during session;Repositioned  Home Living                                          Prior Functioning/Environment              Frequency  Min 2X/week        Progress Toward Goals  OT Goals(current goals can now be found in the care plan section)  Progress towards OT goals: Progressing toward goals  Acute Rehab OT Goals Patient Stated Goal: To go home today OT Goal Formulation: With patient/family Time For Goal Achievement: 08/16/16 Potential to Achieve Goals: Good ADL Goals Pt Will Perform Lower Body Bathing: with supervision;sit to/from stand;with adaptive equipment Pt Will Perform Lower Body Dressing: with supervision;sit to/from stand;with adaptive equipment Pt Will Transfer to Toilet: with supervision;ambulating;bedside commode Pt Will Perform Toileting - Clothing Manipulation and hygiene: with supervision;sit to/from stand Pt Will Perform Tub/Shower Transfer: Shower transfer;with supervision;rolling walker;ambulating;shower seat Additional ADL Goal #1: Pt will state and follow 3/3 THR precautions with selfcare tasks and functional transfers with no more than supervision.   Plan Discharge plan remains appropriate    Co-evaluation                 End of Session Equipment Utilized During Treatment: Rolling walker  OT Visit Diagnosis: Unsteadiness on feet (R26.81);Other abnormalities of gait and mobility (R26.89);Muscle weakness (generalized) (M62.81)   Activity Tolerance Patient tolerated treatment well   Patient Left in chair;with call bell/phone within reach;with family/visitor present   Nurse Communication Mobility status        Time: [id number] OT Time Calculation (min): 15 min  Charges: OT General Charges $OT Visit: 1 Procedure OT  Treatments $Self Care/Home Management : 8-22 mins  Tory EmeraldHolly Brittanyann Wittner, OTR/L [phone]   Hope BuddsJones, Roberth Berling Anne 08/10/2016, 1:46 PM

## 2016-09-19 ENCOUNTER — Encounter: Payer: Self-pay | Admitting: *Deleted

## 2016-09-19 ENCOUNTER — Telehealth: Payer: Self-pay | Admitting: Internal Medicine

## 2016-09-19 NOTE — Telephone Encounter (Signed)
placed one box of eliquis sample up front for pt until [facility] sends medication, will give one week worth, pt aware.

## 2016-09-19 NOTE — Telephone Encounter (Signed)
Patient calling the office for samples of medication:   1.  What medication and dosage are you requesting samples for? Eliquis 5 mg  2.  Are you currently out of this medication? Will run out today    Mrs.Sethi would like to know if she can have a week supply of Eliquis,  medication. Patient takes Eliquis two times a day. Please call to discuss, thanks.

## 2016-09-19 NOTE — Telephone Encounter (Signed)
Will route to refill pool for samples.

## 2016-12-05 ENCOUNTER — Other Ambulatory Visit: Payer: Self-pay | Admitting: Internal Medicine

## 2016-12-05 ENCOUNTER — Telehealth: Payer: Self-pay | Admitting: *Deleted

## 2016-12-05 ENCOUNTER — Other Ambulatory Visit: Payer: Medicare Other | Admitting: *Deleted

## 2016-12-05 DIAGNOSIS — E785 Hyperlipidemia, unspecified: Secondary | ICD-10-CM

## 2016-12-05 LAB — LIPID PANEL
CHOL/HDL RATIO: 2.2 ratio (ref 0.0–5.0)
CHOLESTEROL TOTAL: 153 mg/dL (ref 100–199)
HDL: 71 mg/dL (ref >39)
LDL Calculated: 70 mg/dL (ref 0–99)
TRIGLYCERIDES: 59 mg/dL (ref 0–149)
VLDL CHOLESTEROL CAL: 12 mg/dL (ref 5–40)

## 2016-12-05 MED ORDER — APIXABAN 5 MG PO TABS: 5.0000 mg | ORAL_TABLET | Freq: Two times a day (BID) | ORAL | 1 refills | Status: DC

## 2016-12-05 NOTE — Telephone Encounter (Signed)
Patients wife, Cristy FriedlanderFlorence called and requested [facility] She is requesting 5 mg, but patients chart reflects that the patient takes 2.5 mg. Cristy FriedlanderFlorence can be reached at [phone]. Thanks, MI

## 2016-12-05 NOTE — Telephone Encounter (Signed)
Pt should be on Eliquis 5mg  BID based on Scr and weight. [facility].

## 2016-12-11 NOTE — Progress Notes (Signed)
Cardiology Office Note   Date:  12/12/2016   ID:  Gerald EtienneWilliam C Thornell, DOB 06-17-1924, MRN [id number]  PCP:  Daisy Florooss, Charles Alan, MD  Cardiologist:   Dietrich PatesPaula Ross, MD   Pt presents for f/u of CAD    History of Present Illness: Gerald Wiggins is a 83 y.o. male with a history ofCAD (s/p MI and PTCA), HTN and dyslipidemia., atrial fibrillation and bradycardia and syncope LVEF was 50 to 55% I saw him in June 2017 Since seen his breathing has been good  No CP  No dizziness  He works out at [employer] a few days per week     Outpatient Medications Prior to Visit  Medication Sig Dispense Refill  . amLODipine (NORVASC) 5 MG tablet Take 5 mg by mouth 2 (two) times daily.     Marland Kitchen. apixaban (ELIQUIS) 5 MG TABS tablet Take 1 tablet (5 mg total) by mouth 2 (two) times daily. 180 tablet 1  . atorvastatin (LIPITOR) 40 MG tablet Take 40 mg by mouth daily.    Marland Kitchen. CALCIUM-VITAMIN D PO Take 1 tablet by mouth daily.    Marland Kitchen. losartan (COZAAR) 50 MG tablet Take 1 tablet (50 mg total) by mouth daily. 90 tablet 3  . Multiple Vitamin (MULTIVITAMIN) tablet Take 1 tablet by mouth daily.    Marland Kitchen. OVER THE COUNTER MEDICATION Take 1 capsule by mouth daily. CLEAR LAX  LAXATIVE    . tiZANidine (ZANAFLEX) 2 MG tablet Take 1 tablet (2 mg total) by mouth every 6 (six) hours as needed for muscle spasms. 60 tablet 0  . vitamin C (ASCORBIC ACID) 500 MG tablet Take 500 mg by mouth daily.      Marland Kitchen. HYDROcodone-acetaminophen (NORCO) 5-325 MG tablet Take 1-2 tablets by mouth every 4 (four) hours as needed. (Patient not taking: Reported on 12/12/2016) 60 tablet 0   No facility-administered medications prior to visit.      Allergies:   No known allergies   Past Medical History:  Diagnosis Date  . Abdominal aortic aneurysm (HCC) 11/17/2009   diameter of 3.2  x 3.4 cm.  . Coronary artery disease   . Dysrhythmia    atrial fib  . HTN (hypertension)   . Hyperkalemia   . Hyperlipidemia   . Ischemic heart disease   . Myocardial infarction  (HCC)   . Obesity     Past Surgical History:  Procedure Laterality Date  . ANGIOPLASTY  1988   He had remote angioplasty to the lab following a subendocardial anterior MI.  Marland Kitchen. APPENDECTOMY    . BACK SURGERY    . JOINT REPLACEMENT Right 1988   hip  . TONSILLECTOMY    . TOTAL HIP REVISION Right 08/08/2016   Procedure: TOTAL HIP REVISION;  Surgeon: Gean BirchwoodFrank Rowan, MD;  Location: [facility] OR;  Service: Orthopedics;  Laterality: Right;     Social History:  The patient  reports that he has never smoked. He has never used smokeless tobacco. He reports that he drinks alcohol. He reports that he does not use drugs.   Family History:  The patient's family history includes Congenital heart disease in his son.    ROS:  Please see the history of present illness. All other systems are reviewed and  Negative to the above problem except as noted.    PHYSICAL EXAM: VS:  BP 130/78   Pulse (!) 59   Ht 5\' 6"  (1.676 m)   Wt 74.6 kg (164 lb 6.4 oz)   BMI 26.53  kg/m   GEN: Well nourished, well developed, in no acute distress HEENT: normal Neck: no JVD, carotid bruits, or masses Cardiac: RRR; no murmurs, rubs, or gallops,no edema  Respiratory:  clear to auscultation bilaterally, normal work of breathing GI: soft, nontender, nondistended, + BS  No hepatomegaly  MS: no deformity Moving all extremities   Skin: warm and dry, no rash Neuro:  Strength and sensation are intact Psych: euthymic mood, full affect   EKG:  EKG is ordered today. Atrial fib 58 bpm  Occasional PVC     Lipid Panel    Component Value Date/Time   CHOL 153 12/05/2016 0930   CHOL 169 02/21/2014 0743   TRIG 59 12/05/2016 0930   TRIG 105 02/21/2014 0743   HDL 71 12/05/2016 0930   HDL 46 02/21/2014 0743   CHOLHDL 2.2 12/05/2016 0930   CHOLHDL 3.0 03/14/2016 0748   VLDL 18 03/14/2016 0748   LDLCALC 70 12/05/2016 0930   LDLCALC 102 (H) 02/21/2014 0743      Wt Readings from Last 3 Encounters:  12/12/16 74.6 kg (164 lb 6.4 oz)    08/02/16 70.6 kg (155 lb 11.2 oz)  12/04/15 73.4 kg (161 lb 12.8 oz)      ASSESSMENT AND PLAN: 1  CAD  No symptoms of angina  Keep on current regimn    2.  Atrial fib  Rates OK  Continue Eliquis  Will check CBC and BMET again in the   3  HL LIpids are good  LDL 70     4  HTN  Adequate control  Continue meds    F/U in 1 year      Disposition:   FU with  in   Signed, Dietrich Pates, MD  12/12/2016 8:06 AM    [facility] [address] [place], [place], [place]  [address] Phone: [phone]; Fax: [phone]

## 2016-12-12 ENCOUNTER — Encounter: Payer: Self-pay | Admitting: Internal Medicine

## 2016-12-12 ENCOUNTER — Ambulatory Visit (INDEPENDENT_AMBULATORY_CARE_PROVIDER_SITE_OTHER): Payer: Medicare Other | Admitting: Internal Medicine

## 2016-12-12 VITALS — BP 130/78 | HR 59 | Ht 66.0 in | Wt 164.4 lb

## 2016-12-12 DIAGNOSIS — I482 Chronic atrial fibrillation: Secondary | ICD-10-CM | POA: Diagnosis not present

## 2016-12-12 DIAGNOSIS — I2510 Atherosclerotic heart disease of native coronary artery without angina pectoris: Secondary | ICD-10-CM | POA: Diagnosis not present

## 2016-12-12 DIAGNOSIS — I4821 Permanent atrial fibrillation: Secondary | ICD-10-CM

## 2016-12-12 DIAGNOSIS — E785 Hyperlipidemia, unspecified: Secondary | ICD-10-CM

## 2016-12-12 NOTE — Patient Instructions (Signed)
Medication Instructions:   NO CHANGE  Labwork:  Your physician recommends that you return for lab work in: [place]  Follow-Up:  Your physician wants you to follow-up in: ONE YEAR WITH DR Filbert SchilderOSS You will receive a reminder letter in the mail two months in advance. If you don't receive a letter, please call our office to schedule the follow-up appointment.   If you need a refill on your cardiac medications before your next appointment, please call your pharmacy.

## 2017-02-02 ENCOUNTER — Encounter (HOSPITAL_COMMUNITY): Payer: Self-pay | Admitting: Orthopedic Surgery

## 2017-02-02 NOTE — Addendum Note (Signed)
Addendum  created 02/02/17 1101 by Lyanne Kates, MD   Sign clinical note    

## 2017-03-14 ENCOUNTER — Other Ambulatory Visit: Payer: Medicare Other

## 2017-03-14 DIAGNOSIS — I4821 Permanent atrial fibrillation: Secondary | ICD-10-CM

## 2017-03-14 LAB — CBC
HEMATOCRIT: 42.1 % (ref 37.5–51.0)
Hemoglobin: 13.9 g/dL (ref 13.0–17.7)
MCH: 28.1 pg (ref 26.6–33.0)
MCHC: 33 g/dL (ref 31.5–35.7)
MCV: 85 fL (ref 79–97)
Platelets: 214 10*3/uL (ref 150–379)
RBC: 4.95 x10E6/uL (ref 4.14–5.80)
RDW: 15.2 % (ref 12.3–15.4)
WBC: 7.1 10*3/uL (ref 3.4–10.8)

## 2017-03-14 LAB — BASIC METABOLIC PANEL
BUN/Creatinine Ratio: 12 (ref 10–24)
BUN: 12 mg/dL (ref 8–27)
CALCIUM: 9.9 mg/dL (ref 8.6–10.2)
CO2: 24 mmol/L (ref 20–29)
Chloride: 103 mmol/L (ref 96–106)
Creatinine, Ser: 1.02 mg/dL (ref 0.76–1.27)
GFR, EST AFRICAN AMERICAN: 75 mL/min/{1.73_m2} (ref >59)
GFR, EST NON AFRICAN AMERICAN: 65 mL/min/{1.73_m2} (ref >59)
Glucose: 112 mg/dL — ABNORMAL HIGH (ref 65–99)
POTASSIUM: 5.2 mmol/L (ref 3.5–5.2)
Sodium: 141 mmol/L (ref 134–144)

## 2017-05-18 ENCOUNTER — Telehealth: Payer: Self-pay | Admitting: Internal Medicine

## 2017-05-18 NOTE — Telephone Encounter (Signed)
°  New Prob  Calling regarding recommended switch from Eliquis to Warfarin per [facility]. Please call.

## 2017-05-24 NOTE — Telephone Encounter (Signed)
Patient's wife states PCP has changed his medication from Eliquis to warfarin due to significant cost difference.  PCP will manage INR.   No longer on Eliquis, starting warfarin tonight.

## 2018-01-11 IMAGING — CR DG PORTABLE PELVIS
1 series · 1 of 1 positions shown · non-contrast
Comparison: None.

CLINICAL DATA: Right hip revision

EXAM:
PORTABLE PELVIS 1-2 VIEWS

[AP]
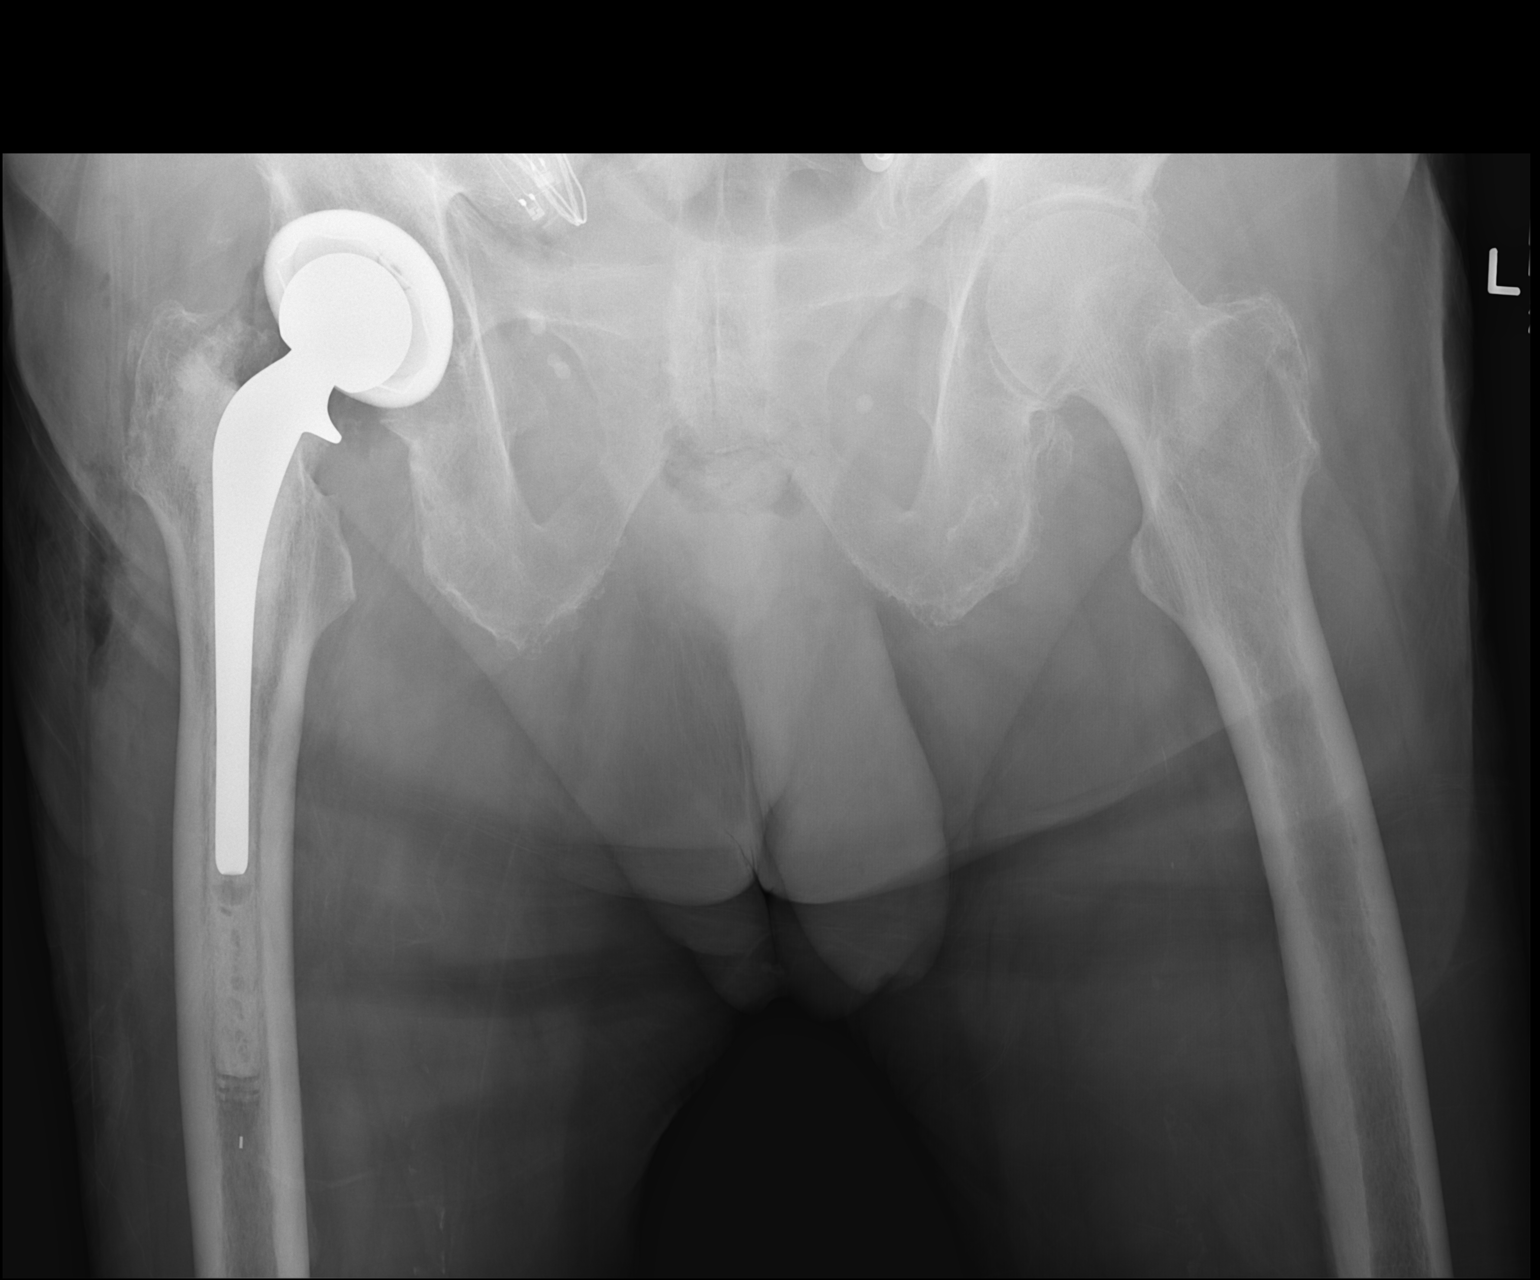

[1 of 1 positions shown; findings below may reference images not displayed]

FINDINGS: Right hip prosthesis is noted in place. No acute bony abnormality is
seen. The soft tissue abnormality is noted.
IMPRESSION: Right hip replacement in satisfactory position.

## 2018-02-23 ENCOUNTER — Ambulatory Visit (INDEPENDENT_AMBULATORY_CARE_PROVIDER_SITE_OTHER): Payer: Medicare Other | Admitting: Internal Medicine

## 2018-02-23 ENCOUNTER — Encounter: Payer: Self-pay | Admitting: Internal Medicine

## 2018-02-23 VITALS — BP 130/66 | HR 59 | Ht 66.0 in | Wt 170.6 lb

## 2018-02-23 DIAGNOSIS — E875 Hyperkalemia: Secondary | ICD-10-CM

## 2018-02-23 DIAGNOSIS — I482 Chronic atrial fibrillation: Secondary | ICD-10-CM | POA: Diagnosis not present

## 2018-02-23 DIAGNOSIS — R001 Bradycardia, unspecified: Secondary | ICD-10-CM | POA: Diagnosis not present

## 2018-02-23 DIAGNOSIS — I493 Ventricular premature depolarization: Secondary | ICD-10-CM | POA: Diagnosis not present

## 2018-02-23 DIAGNOSIS — I4821 Permanent atrial fibrillation: Secondary | ICD-10-CM

## 2018-02-23 LAB — BASIC METABOLIC PANEL
BUN / CREAT RATIO: 21 (ref 10–24)
BUN: 19 mg/dL (ref 10–36)
CO2: 23 mmol/L (ref 20–29)
Calcium: 9.4 mg/dL (ref 8.6–10.2)
Chloride: 102 mmol/L (ref 96–106)
Creatinine, Ser: 0.89 mg/dL (ref 0.76–1.27)
GFR calc Af Amer: 87 mL/min/{1.73_m2} (ref >59)
GFR, EST NON AFRICAN AMERICAN: 75 mL/min/{1.73_m2} (ref >59)
Glucose: 108 mg/dL — ABNORMAL HIGH (ref 65–99)
Potassium: 4.3 mmol/L (ref 3.5–5.2)
Sodium: 140 mmol/L (ref 134–144)

## 2018-02-23 NOTE — Progress Notes (Signed)
Cardiology Office Note   Date:  02/23/2018   ID:  Gerald Wiggins, DOB [birth year], MRN [id number]  PCP:  Daisy Floro, MD  Cardiologist:   Dietrich Pates, MD   Pt presents for f/u of CAD and atrial fib     History of Present Illness: Gerald Wiggins is a 84 y.o. male with a history ofCAD (s/p MI and PTCA), HTN and dyslipidemia., atrial fibrillation and bradycardia and syncope LVEF was 50 to 55% I saw him in July 2018   He denies CP   Breathing is OK     He is no longer driving   Doesn't go to gym any more but does work in yard    Outpatient Medications Prior to Visit  Medication Sig Dispense Refill  . amLODipine (NORVASC) 5 MG tablet Take 5 mg by mouth 2 (two) times daily.     Marland Kitchen amoxicillin (AMOXIL) 500 MG capsule Take 500 mg by mouth as directed. Before dental appointments    . apixaban (ELIQUIS) 5 MG TABS tablet Take 5 mg by mouth 2 (two) times daily.    Marland Kitchen atorvastatin (LIPITOR) 40 MG tablet Take 40 mg by mouth daily.    Marland Kitchen CALCIUM-VITAMIN D PO Take 1 tablet by mouth daily.    . finasteride (PROSCAR) 5 MG tablet Take 5 mg by mouth daily.     Marland Kitchen losartan (COZAAR) 50 MG tablet Take 1 tablet (50 mg total) by mouth daily. 90 tablet 3  . Multiple Vitamin (MULTIVITAMIN) tablet Take 1 tablet by mouth daily.    Marland Kitchen OVER THE COUNTER MEDICATION Clear lax    . vitamin C (ASCORBIC ACID) 500 MG tablet Take 500 mg by mouth daily.      Marland Kitchen OVER THE COUNTER MEDICATION Take 1 capsule by mouth daily. CLEAR LAX  LAXATIVE    . tiZANidine (ZANAFLEX) 2 MG tablet Take 1 tablet (2 mg total) by mouth every 6 (six) hours as needed for muscle spasms. (Patient not taking: Reported on 02/23/2018) 60 tablet 0  . warfarin (COUMADIN) 1 MG tablet Take 1 mg by mouth as directed.     No facility-administered medications prior to visit.      Allergies:   No known allergies   Past Medical History:  Diagnosis Date  . Abdominal aortic aneurysm (HCC) 11/17/2009   diameter of 3.2  x 3.4 cm.  . Coronary artery  disease   . Dysrhythmia    atrial fib  . HTN (hypertension)   . Hyperkalemia   . Hyperlipidemia   . Ischemic heart disease   . Myocardial infarction (HCC)   . Obesity     Past Surgical History:  Procedure Laterality Date  . ANGIOPLASTY  1988   He had remote angioplasty to the lab following a subendocardial anterior MI.  Marland Kitchen APPENDECTOMY    . BACK SURGERY    . JOINT REPLACEMENT Right 1988   hip  . TONSILLECTOMY    . TOTAL HIP REVISION Right 08/08/2016   Procedure: TOTAL HIP REVISION;  Surgeon: Gean Birchwood, MD;  Location: [facility] OR;  Service: Orthopedics;  Laterality: Right;     Social History:  The patient  reports that he has never smoked. He has never used smokeless tobacco. He reports that he drinks alcohol. He reports that he does not use drugs.   Family History:  The patient's family history includes Congenital heart disease in his son.    ROS:  Please see the history of present illness. All other systems  are reviewed and  Negative to the above problem except as noted.    PHYSICAL EXAM: VS:  BP 130/66   Pulse (!) 59   Ht 5\' 6"  (1.676 m)   Wt 170 lb 9.6 oz (77.4 kg)   BMI 27.54 kg/m   GEN: Well nourished, well developed, in no acute distress  HEENT: normal  Neck: JVP is normal carotid bruits, or masses Cardiac: RRR; no murmurs, rubs, or gallops,no edema  Respiratory:  clear to auscultation bilaterally, normal work of breathing GI: soft, nontender, nondistended, + BS  No hepatomegaly  MS: no deformity Moving all extremities   Skin: warm and dry, no rash Neuro:  Strength and sensation are intact Psych: euthymic mood, full affect   EKG:  EKG is ordered today. Atrial fib 59 bpm  Occasional PVC    Lipid Panel    Component Value Date/Time   CHOL 153 12/05/2016 0930   CHOL 169 02/21/2014 0743   TRIG 59 12/05/2016 0930   TRIG 105 02/21/2014 0743   HDL 71 12/05/2016 0930   HDL 46 02/21/2014 0743   CHOLHDL 2.2 12/05/2016 0930   CHOLHDL 3.0 03/14/2016 0748   VLDL  18 03/14/2016 0748   LDLCALC 70 12/05/2016 0930   LDLCALC 102 (H) 02/21/2014 0743      Wt Readings from Last 3 Encounters:  02/23/18 170 lb 9.6 oz (77.4 kg)  12/12/16 164 lb 6.4 oz (74.6 kg)  08/02/16 155 lb 11.2 oz (70.6 kg)      ASSESSMENT AND PLAN: 1  CAD  No symptoms of angina  Keep on current regimn    2.  Atrial fib  Rate a little slow     PVCs  Will get 48 hour holter   Check BMET   K in July was 5.5 Continue Eliqis  3  Hyperkalemia   Repeat BMET   On losartan     3  HL LIpids are good  LDL 83       4  HTN  Adequate control  Continue meds  Check BMET   Encouraged him to stay active   He is not driving so does not go to gym anymore   Activity is not what it use to be but no complaints    F/U in [place]       Disposition:   FU with  in   Signed, Dietrich PatesPaula Jaksen Fiorella, MD  02/23/2018 8:40 AM    [facility] [address], [place], [place]  [address] Phone: [phone]; Fax: [phone]

## 2018-02-23 NOTE — Patient Instructions (Signed)
Your physician recommends that you continue on your current medications as directed. Please refer to the Current Medication list given to you today.  Your physician has recommended that you wear a holter monitor. Holter monitors are medical devices that record the heart's electrical activity. Doctors most often use these monitors to diagnose arrhythmias. Arrhythmias are problems with the speed or rhythm of the heartbeat. The monitor is a small, portable device. You can wear one while you do your normal daily activities. This is usually used to diagnose what is causing palpitations/syncope (passing out).  Your physician recommends that you return for lab work today (BMET).  Your physician wants you to follow-up in: May 2020 with Dr. Tenny Crawoss.  You will receive a reminder letter in the mail two months in advance. If you don't receive a letter, please call our office to schedule the follow-up appointment.

## 2018-03-05 ENCOUNTER — Ambulatory Visit (INDEPENDENT_AMBULATORY_CARE_PROVIDER_SITE_OTHER): Payer: Medicare Other

## 2018-03-05 DIAGNOSIS — I493 Ventricular premature depolarization: Secondary | ICD-10-CM

## 2018-03-05 DIAGNOSIS — R001 Bradycardia, unspecified: Secondary | ICD-10-CM | POA: Diagnosis not present

## 2018-03-13 ENCOUNTER — Other Ambulatory Visit: Payer: Self-pay | Admitting: *Deleted

## 2018-03-13 DIAGNOSIS — R001 Bradycardia, unspecified: Secondary | ICD-10-CM

## 2018-03-14 ENCOUNTER — Other Ambulatory Visit: Payer: Medicare Other | Admitting: *Deleted

## 2018-03-14 DIAGNOSIS — R001 Bradycardia, unspecified: Secondary | ICD-10-CM

## 2018-03-14 LAB — TSH: TSH: 1.41 u[IU]/mL (ref 0.450–4.500)

## 2023-10-12 DEATH — deceased
